# Patient Record
Sex: Female | Born: 1970
Health system: Southern US, Community
[De-identification: ages and names within clinical notes are randomized; demographics above are authoritative.]

## PROBLEM LIST (undated history)

## (undated) DIAGNOSIS — F32A Depression, unspecified: Secondary | ICD-10-CM

## (undated) DIAGNOSIS — Z8041 Family history of malignant neoplasm of ovary: Secondary | ICD-10-CM

## (undated) DIAGNOSIS — Z9189 Other specified personal risk factors, not elsewhere classified: Secondary | ICD-10-CM

## (undated) DIAGNOSIS — F329 Major depressive disorder, single episode, unspecified: Secondary | ICD-10-CM

## (undated) DIAGNOSIS — Z1509 Genetic susceptibility to other malignant neoplasm: Secondary | ICD-10-CM

## (undated) DIAGNOSIS — Z1501 Genetic susceptibility to malignant neoplasm of breast: Secondary | ICD-10-CM

## (undated) DIAGNOSIS — E559 Vitamin D deficiency, unspecified: Secondary | ICD-10-CM

## (undated) DIAGNOSIS — F419 Anxiety disorder, unspecified: Secondary | ICD-10-CM

## (undated) HISTORY — DX: Vitamin D deficiency, unspecified: E55.9

## (undated) HISTORY — DX: Anxiety disorder, unspecified: F41.9

## (undated) HISTORY — DX: Depression, unspecified: F32.A

## (undated) HISTORY — DX: Genetic susceptibility to malignant neoplasm of breast: Z15.01

## (undated) HISTORY — PX: COLPOSCOPY: SHX161

## (undated) HISTORY — DX: Major depressive disorder, single episode, unspecified: F32.9

## (undated) HISTORY — DX: Other specified personal risk factors, not elsewhere classified: Z91.89

## (undated) HISTORY — DX: Genetic susceptibility to other malignant neoplasm: Z15.09

## (undated) HISTORY — DX: Family history of malignant neoplasm of ovary: Z80.41

---

## 2004-12-10 ENCOUNTER — Observation Stay: Payer: Self-pay | Admitting: Unknown Physician Specialty

## 2005-05-02 ENCOUNTER — Observation Stay: Payer: Self-pay

## 2005-05-04 ENCOUNTER — Inpatient Hospital Stay: Payer: Self-pay | Admitting: Unknown Physician Specialty

## 2008-12-31 ENCOUNTER — Ambulatory Visit: Payer: Self-pay

## 2011-09-27 ENCOUNTER — Other Ambulatory Visit: Payer: Self-pay | Admitting: Obstetrics and Gynecology

## 2012-08-01 ENCOUNTER — Ambulatory Visit: Payer: Self-pay | Admitting: Obstetrics and Gynecology

## 2014-07-18 ENCOUNTER — Ambulatory Visit: Payer: Self-pay | Admitting: General Practice

## 2015-10-23 LAB — HM PAP SMEAR: HM Pap smear: NEGATIVE

## 2016-11-22 DIAGNOSIS — Z1509 Genetic susceptibility to other malignant neoplasm: Secondary | ICD-10-CM | POA: Insufficient documentation

## 2016-11-22 DIAGNOSIS — Z1501 Genetic susceptibility to malignant neoplasm of breast: Secondary | ICD-10-CM

## 2016-11-22 HISTORY — DX: Genetic susceptibility to other malignant neoplasm: Z15.09

## 2016-11-22 HISTORY — DX: Genetic susceptibility to malignant neoplasm of breast: Z15.01

## 2016-11-30 ENCOUNTER — Other Ambulatory Visit: Payer: Self-pay | Admitting: Obstetrics and Gynecology

## 2016-11-30 DIAGNOSIS — Z1231 Encounter for screening mammogram for malignant neoplasm of breast: Secondary | ICD-10-CM

## 2016-12-24 ENCOUNTER — Ambulatory Visit
Admission: RE | Admit: 2016-12-24 | Discharge: 2016-12-24 | Disposition: A | Discharge status: Home / Self Care | Payer: Managed Care, Other (non HMO) | Source: Ambulatory Visit | Attending: Obstetrics and Gynecology | Admitting: Obstetrics and Gynecology

## 2016-12-24 ENCOUNTER — Encounter: Payer: Self-pay | Admitting: Radiology

## 2016-12-24 DIAGNOSIS — Z1231 Encounter for screening mammogram for malignant neoplasm of breast: Secondary | ICD-10-CM | POA: Insufficient documentation

## 2016-12-24 LAB — HM MAMMOGRAPHY: HM Mammogram: NORMAL (ref 0–4)

## 2017-12-20 ENCOUNTER — Encounter: Payer: Self-pay | Admitting: Obstetrics and Gynecology

## 2017-12-20 ENCOUNTER — Ambulatory Visit (INDEPENDENT_AMBULATORY_CARE_PROVIDER_SITE_OTHER): Payer: Managed Care, Other (non HMO) | Admitting: Obstetrics and Gynecology

## 2017-12-20 VITALS — BP 128/90 | HR 85 | Ht 67.0 in | Wt 192.0 lb

## 2017-12-20 DIAGNOSIS — R4586 Emotional lability: Secondary | ICD-10-CM | POA: Diagnosis not present

## 2017-12-20 DIAGNOSIS — Z01419 Encounter for gynecological examination (general) (routine) without abnormal findings: Secondary | ICD-10-CM

## 2017-12-20 DIAGNOSIS — Z1231 Encounter for screening mammogram for malignant neoplasm of breast: Secondary | ICD-10-CM

## 2017-12-20 DIAGNOSIS — E782 Mixed hyperlipidemia: Secondary | ICD-10-CM | POA: Insufficient documentation

## 2017-12-20 DIAGNOSIS — Z30431 Encounter for routine checking of intrauterine contraceptive device: Secondary | ICD-10-CM

## 2017-12-20 DIAGNOSIS — E559 Vitamin D deficiency, unspecified: Secondary | ICD-10-CM | POA: Diagnosis not present

## 2017-12-20 DIAGNOSIS — Z1239 Encounter for other screening for malignant neoplasm of breast: Secondary | ICD-10-CM

## 2017-12-20 MED ORDER — SERTRALINE HCL 50 MG PO TABS: 50.0000 mg | ORAL_TABLET | Freq: Every day | ORAL | 3 refills | Status: DC

## 2017-12-20 NOTE — Progress Notes (Signed)
PCP:  Patient, No Pcp Per   Chief Complaint  Patient presents with  . Gynecologic Exam     HPI:      Sharon Rhodes is a 47 y.o. G1P1001 who LMP was No LMP recorded. Patient is not currently having periods (Reason: IUD)., presents today for her annual examination.  Her menses are absent with IUD.  Dysmenorrhea none. She does have occas random light intermenstrual bleeding.  Sex activity: single partner, contraception - IUD. Mirena placed 11/30/16 Last Pap: October 23, 2015  Results were: no abnormalities /neg HPV DNA  Hx of STDs: none  Last mammogram: December 24, 2016  Results were: normal--routine follow-up in 12 months There is no FH of breast cancer. There is a FH of ovarian and uterine cancer in her pat aunt. Pt is CDKN2A positive 2017, which is increased risk for melanoma and pancreatic cancer. Pt sees derm Q6 months and since no FH pancreatic cancer, nothing further to do at this time. The patient does not do self-breast exams.  Tobacco use: The patient denies current or previous tobacco use. Alcohol use: social drinker No drug use.  Exercise: moderately active  She does get adequate calcium and Vitamin D in her diet. She was severely Vit D deficient last yr and is taking supp regularly.   Her fasting lipids were borderline last yr. Pt due for lab check but wants to do them next yr because she is doing diet changes for wt loss and has already lost 5# this month.   Pt takes zoloft 50 mg almost daily for moods, particularly during certain times in her cycle. She has a stressful job and this keeps her stable. No side effects. Wants to cont meds.  Past Medical History:  Diagnosis Date  . Anxiety   . BRCA gene positive 11/2016   CDKN2A pos (My Risk) increased risk of Pancreatic and Melanoma  . Depression   . Vitamin D deficiency     Past Surgical History:  Procedure Laterality Date  . COLPOSCOPY      Family History  Problem Relation Age of Onset  . Melanoma Sister  56       several times  . Breast cancer Paternal Grandmother 65  . Ovarian cancer Paternal Aunt 51    Social History   Socioeconomic History  . Marital status: Married    Spouse name: Not on file  . Number of children: Not on file  . Years of education: Not on file  . Highest education level: Not on file  Social Needs  . Financial resource strain: Not on file  . Food insecurity - worry: Not on file  . Food insecurity - inability: Not on file  . Transportation needs - medical: Not on file  . Transportation needs - non-medical: Not on file  Occupational History  . Not on file  Tobacco Use  . Smoking status: Never Smoker  . Smokeless tobacco: Never Used  Substance and Sexual Activity  . Alcohol use: Yes  . Drug use: No  . Sexual activity: Yes    Partners: Male    Birth control/protection: IUD  Other Topics Concern  . Not on file  Social History Narrative  . Not on file    Current Meds  Medication Sig  . sertraline (ZOLOFT) 50 MG tablet Take 1 tablet (50 mg total) by mouth daily.  . [DISCONTINUED] sertraline (ZOLOFT) 50 MG tablet Take 50 mg by mouth daily.     ROS:  Review  of Systems  Constitutional: Negative for fatigue, fever and unexpected weight change.  Respiratory: Negative for cough, shortness of breath and wheezing.   Cardiovascular: Negative for chest pain, palpitations and leg swelling.  Gastrointestinal: Negative for blood in stool, constipation, diarrhea, nausea and vomiting.  Endocrine: Negative for cold intolerance, heat intolerance and polyuria.  Genitourinary: Negative for dyspareunia, dysuria, flank pain, frequency, genital sores, hematuria, menstrual problem, pelvic pain, urgency, vaginal bleeding, vaginal discharge and vaginal pain.  Musculoskeletal: Negative for back pain, joint swelling and myalgias.  Skin: Negative for rash.  Neurological: Negative for dizziness, syncope, light-headedness, numbness and headaches.  Hematological: Negative for  adenopathy.  Psychiatric/Behavioral: Negative for agitation, confusion, sleep disturbance and suicidal ideas. The patient is not nervous/anxious.      Objective: BP 128/90 (BP Location: Left Arm, Patient Position: Sitting, Cuff Size: Normal)   Pulse 85   Ht _0  (1.702 m)   Wt 192 lb (87.1 kg)   BMI 30.07 kg/m    Physical Exam  Constitutional: She is oriented to person, place, and time. She appears well-developed and well-nourished.  Genitourinary: Vagina normal and uterus normal. There is no rash or tenderness on the right labia. There is no rash or tenderness on the left labia. No erythema or tenderness in the vagina. No vaginal discharge found. Right adnexum does not display mass and does not display tenderness. Left adnexum does not display mass and does not display tenderness.  Cervix exhibits visible IUD strings. Cervix does not exhibit motion tenderness or polyp. Uterus is not enlarged or tender.  Neck: Normal range of motion. No thyromegaly present.  Cardiovascular: Normal rate, regular rhythm and normal heart sounds.  No murmur heard. Pulmonary/Chest: Effort normal and breath sounds normal. Right breast exhibits no mass, no nipple discharge, no skin change and no tenderness. Left breast exhibits no mass, no nipple discharge, no skin change and no tenderness.  Abdominal: Soft. There is no tenderness. There is no guarding.  Musculoskeletal: Normal range of motion.  Neurological: She is alert and oriented to person, place, and time. No cranial nerve deficit.  Psychiatric: She has a normal mood and affect. Her behavior is normal.  Vitals reviewed.   Assessment/Plan: Encounter for annual routine gynecological examination  Encounter for routine checking of intrauterine contraceptive device (IUD) - IUD in place. Due for rem 1/23  Screening for breast cancer - Pt to sched mammo. - Plan: MM DIGITAL SCREENING BILATERAL  Mood changes - Pt takes zoloft almost daily with sx relief.  Rx RF. F/u prn.  - Plan: sertraline (ZOLOFT) 50 MG tablet  Mixed hyperlipidemia - REchk labs next yr per pt request since she is doing diet/exercise/wt loss changes.   Vitamin D deficiency - Taking supp. Rechk next yr with other labs.  Meds ordered this encounter  Medications  . sertraline (ZOLOFT) 50 MG tablet    Sig: Take 1 tablet (50 mg total) by mouth daily.    Dispense:  90 tablet    Refill:  3             GYN counsel breast self exam, mammography screening, adequate intake of calcium and vitamin D, diet and exercise     F/U  Return in about 1 year (around 12/20/2018).  Alicia B. Copland, PA-C 12/20/2017 2:25 PM

## 2017-12-20 NOTE — Patient Instructions (Addendum)
I value your feedback and entrusting us with your care. If you get a [facility] patient survey, I would appreciate you taking the time to let us know about your experience today. Thank you!  [facility]: [phone]    

## 2018-01-12 ENCOUNTER — Ambulatory Visit
Admission: RE | Admit: 2018-01-12 | Discharge: 2018-01-12 | Disposition: A | Discharge status: Home / Self Care | Payer: Managed Care, Other (non HMO) | Source: Ambulatory Visit | Attending: Obstetrics and Gynecology | Admitting: Obstetrics and Gynecology

## 2018-01-12 DIAGNOSIS — Z1239 Encounter for other screening for malignant neoplasm of breast: Secondary | ICD-10-CM

## 2018-01-12 DIAGNOSIS — Z1231 Encounter for screening mammogram for malignant neoplasm of breast: Secondary | ICD-10-CM | POA: Diagnosis present

## 2018-01-13 ENCOUNTER — Encounter: Payer: Self-pay | Admitting: Obstetrics and Gynecology

## 2018-03-01 ENCOUNTER — Other Ambulatory Visit: Payer: Self-pay | Admitting: Obstetrics and Gynecology

## 2018-03-01 NOTE — Telephone Encounter (Signed)
Please advise for refill. Thank you.  

## 2019-01-15 ENCOUNTER — Other Ambulatory Visit: Payer: Self-pay | Admitting: Obstetrics and Gynecology

## 2019-01-15 DIAGNOSIS — Z1231 Encounter for screening mammogram for malignant neoplasm of breast: Secondary | ICD-10-CM

## 2019-01-18 ENCOUNTER — Encounter: Payer: Self-pay | Admitting: Obstetrics and Gynecology

## 2019-01-18 ENCOUNTER — Other Ambulatory Visit (HOSPITAL_COMMUNITY)
Admission: RE | Admit: 2019-01-18 | Discharge: 2019-01-18 | Disposition: A | Discharge status: Home / Self Care | Payer: Managed Care, Other (non HMO) | Source: Ambulatory Visit | Attending: Obstetrics and Gynecology | Admitting: Obstetrics and Gynecology

## 2019-01-18 ENCOUNTER — Ambulatory Visit (INDEPENDENT_AMBULATORY_CARE_PROVIDER_SITE_OTHER): Payer: Managed Care, Other (non HMO) | Admitting: Obstetrics and Gynecology

## 2019-01-18 VITALS — BP 140/82 | HR 82 | Ht 66.5 in | Wt 180.0 lb

## 2019-01-18 DIAGNOSIS — Z01419 Encounter for gynecological examination (general) (routine) without abnormal findings: Secondary | ICD-10-CM

## 2019-01-18 DIAGNOSIS — R4586 Emotional lability: Secondary | ICD-10-CM

## 2019-01-18 DIAGNOSIS — Z124 Encounter for screening for malignant neoplasm of cervix: Secondary | ICD-10-CM

## 2019-01-18 DIAGNOSIS — Z30431 Encounter for routine checking of intrauterine contraceptive device: Secondary | ICD-10-CM

## 2019-01-18 DIAGNOSIS — Z1151 Encounter for screening for human papillomavirus (HPV): Secondary | ICD-10-CM | POA: Insufficient documentation

## 2019-01-18 DIAGNOSIS — T8332XA Displacement of intrauterine contraceptive device, initial encounter: Secondary | ICD-10-CM

## 2019-01-18 DIAGNOSIS — Z1239 Encounter for other screening for malignant neoplasm of breast: Secondary | ICD-10-CM

## 2019-01-18 MED ORDER — SERTRALINE HCL 100 MG PO TABS: 100.0000 mg | ORAL_TABLET | Freq: Every day | ORAL | 3 refills | Status: DC

## 2019-01-18 NOTE — Progress Notes (Signed)
PCP:  Patient, No Pcp Per   Chief Complaint  Patient presents with  . Gynecologic Exam     HPI:      [name] is a 48 y.o. G1P1001 who LMP was Patient's last menstrual period was 12/29/2018 (approximate)., presents today for her annual examination.  Her menses are monthly now with IUD, light, lasting 2-4 days.  Dysmenorrhea none. She does not have intermenstrual bleeding. Has vasomotor sx.  Sex activity: single partner, contraception - IUD. Mirena placed 11/30/16. Strings were visibile in cx os last yr. Last Pap: October 23, 2015  Results were: no abnormalities /neg HPV DNA  Hx of STDs: none  Last mammogram: 2 /21/19 Results were: normal--routine follow-up in 12 months. Has mammo sched 3/20. There is no FH of breast cancer. There is a FH of ovarian and uterine cancer in her pat aunt. Pt is CDKN2A positive 11/2016, which is increased risk for melanoma and pancreatic cancer. Pt sees derm Q6 months and since no FH pancreatic cancer, nothing further to do at this time. The patient does not do self-breast exams.  Tobacco use: The patient denies current or previous tobacco use. Alcohol use: social drinker No drug use.  Exercise: moderately active  She does get adequate calcium and Vitamin D in her diet. She was severely Vit D deficient last yr and is taking supp regularly.   Her fasting lipids were borderline 2018. Pt not interested in lab check . Has lost wt from last yr.   Pt takes zoloft 100 mg almost daily for moods, particularly during certain times in her cycle/work stress. She has a stressful job and this keeps her stable. No side effects. Wants to cont meds.  Past Medical History:  Diagnosis Date  . Anxiety   . Depression   . Family history of ovarian cancer   . Increased risk of melanoma in patient without dysplastic nevus   . Monoallelic mutation of CDKN2A gene 11/2016   MyRisk heterozygous ([id number]); increased risk of melanoma and pancreatic cancer  . Vitamin D  deficiency     Past Surgical History:  Procedure Laterality Date  . COLPOSCOPY      Family History  Problem Relation Age of Onset  . Melanoma Sister 39       several times  . Breast cancer Paternal Grandmother 77  . Ovarian cancer Paternal Aunt 38    Social History   Socioeconomic History  . Marital status: Married    Spouse name: Not on file  . Number of children: Not on file  . Years of education: Not on file  . Highest education level: Not on file  Occupational History  . Not on file  Social Needs  . Financial resource strain: Not on file  . Food insecurity:    Worry: Not on file    Inability: Not on file  . Transportation needs:    Medical: Not on file    Non-medical: Not on file  Tobacco Use  . Smoking status: Never Smoker  . Smokeless tobacco: Never Used  Substance and Sexual Activity  . Alcohol use: Yes  . Drug use: No  . Sexual activity: Yes    Partners: Male    Birth control/protection: I.U.D.    Comment: Mirena  Lifestyle  . Physical activity:    Days per week: 0 days    Minutes per session: 0 min  . Stress: Not at all  Relationships  . Social connections:    Talks on  phone: Once a week    Gets together: Once a week    Attends religious service: More than 4 times per year    Active member of club or organization: No    Attends meetings of clubs or organizations: Never    Relationship status: Divorced  . Intimate partner violence:    Fear of current or ex partner: No    Emotionally abused: No    Physically abused: No    Forced sexual activity: No  Other Topics Concern  . Not on file  Social History Narrative  . Not on file    Current Meds  Medication Sig  . levonorgestrel (MIRENA) 20 MCG/24HR IUD 1 each by Intrauterine route once.  . sertraline (ZOLOFT) 100 MG tablet Take 1 tablet (100 mg total) by mouth daily.  . [DISCONTINUED] sertraline (ZOLOFT) 100 MG tablet TAKE ONE TABLET BY MOUTH ONCE DAILY     ROS:  Review of Systems    Constitutional: Negative for fatigue, fever and unexpected weight change.  Respiratory: Negative for cough, shortness of breath and wheezing.   Cardiovascular: Negative for chest pain, palpitations and leg swelling.  Gastrointestinal: Negative for blood in stool, constipation, diarrhea, nausea and vomiting.  Endocrine: Negative for cold intolerance, heat intolerance and polyuria.  Genitourinary: Negative for dyspareunia, dysuria, flank pain, frequency, genital sores, hematuria, menstrual problem, pelvic pain, urgency, vaginal bleeding, vaginal discharge and vaginal pain.  Musculoskeletal: Negative for back pain, joint swelling and myalgias.  Skin: Negative for rash.  Neurological: Negative for dizziness, syncope, light-headedness, numbness and headaches.  Hematological: Negative for adenopathy.  Psychiatric/Behavioral: Positive for dysphoric mood. Negative for agitation, confusion, sleep disturbance and suicidal ideas. The patient is not nervous/anxious.      Objective: BP 140/82   Pulse 82   Ht 5' 6.5" (1.689 m)   Wt 180 lb (81.6 kg)   LMP 12/29/2018 (Approximate)   BMI 28.62 kg/m    Physical Exam Constitutional:      Appearance: She is well-developed.  Genitourinary:     Vulva, vagina, uterus, right adnexa and left adnexa normal.     No vulval lesion or tenderness noted.     No vaginal discharge, erythema or tenderness.     No cervical motion tenderness or polyp.     No IUD strings visualized.     Uterus is not enlarged or tender.     No right or left adnexal mass present.     Right adnexa not tender.     Left adnexa not tender.     Genitourinary Comments: IUD STRINGS NO LONGER VISIBLE/PALPABLE IN CX OS  Neck:     Musculoskeletal: Normal range of motion.     Thyroid: No thyromegaly.  Cardiovascular:     Rate and Rhythm: Normal rate and regular rhythm.     Heart sounds: Normal heart sounds. No murmur.  Pulmonary:     Effort: Pulmonary effort is normal.     Breath  sounds: Normal breath sounds.  Chest:     Breasts:        Right: No mass, nipple discharge, skin change or tenderness.        Left: No mass, nipple discharge, skin change or tenderness.  Abdominal:     Palpations: Abdomen is soft.     Tenderness: There is no abdominal tenderness. There is no guarding.  Musculoskeletal: Normal range of motion.  Neurological:     Mental Status: She is alert and oriented to person, place, and time.  Cranial Nerves: No cranial nerve deficit.  Psychiatric:        Behavior: Behavior normal.  Vitals signs reviewed.     Assessment/Plan: Encounter for annual routine gynecological examination  Cervical cancer screening - Plan: Cytology - PAP  Screening for HPV (human papillomavirus) - Plan: Cytology - PAP  Screening for breast cancer - Pt has mammo sched  Encounter for routine checking of intrauterine contraceptive device (IUD)  Intrauterine contraceptive device threads lost, initial encounter - Check GYN u/s. Will call with results.   Mood changes - Rx RF zoloft. F/u prn.  - Plan: sertraline (ZOLOFT) 100 MG tablet  Meds ordered this encounter  Medications  . sertraline (ZOLOFT) 100 MG tablet    Sig: Take 1 tablet (100 mg total) by mouth daily.    Dispense:  90 tablet    Refill:  3    Order Specific Question:   Supervising Provider    Answer:   Nadara Mustard [[id number]]             GYN counsel breast self exam, mammography screening, adequate intake of calcium and vitamin D, diet and exercise     F/U  Return in about 1 day (around 01/19/2019) for GYN u/s for IUD string check./1 yr annual  Quamesha Mullet B. Tajuana Kniskern, PA-C 01/18/2019 3:35 PM

## 2019-01-18 NOTE — Patient Instructions (Signed)
I value your feedback and entrusting us with your care. If you get a  patient survey, I would appreciate you taking the time to let us know about your experience today. Thank you! 

## 2019-01-19 ENCOUNTER — Encounter: Payer: Self-pay | Admitting: Obstetrics and Gynecology

## 2019-01-19 ENCOUNTER — Ambulatory Visit (INDEPENDENT_AMBULATORY_CARE_PROVIDER_SITE_OTHER): Payer: Managed Care, Other (non HMO) | Admitting: Obstetrics and Gynecology

## 2019-01-19 ENCOUNTER — Other Ambulatory Visit: Payer: Self-pay | Admitting: Obstetrics and Gynecology

## 2019-01-19 ENCOUNTER — Ambulatory Visit (INDEPENDENT_AMBULATORY_CARE_PROVIDER_SITE_OTHER): Payer: Managed Care, Other (non HMO)

## 2019-01-19 DIAGNOSIS — T8332XA Displacement of intrauterine contraceptive device, initial encounter: Secondary | ICD-10-CM

## 2019-01-19 DIAGNOSIS — N83292 Other ovarian cyst, left side: Secondary | ICD-10-CM

## 2019-01-19 NOTE — Progress Notes (Signed)
Obstetrics & Gynecology Office Visit   Chief Complaint:  Chief Complaint  Patient presents with  . Follow-up    GYN Ultrasound for IUD location    History of Present Illness: 48 y.o. patient presenting for follow up of Mirena IUD placement 1.5 years ago.  The indication for her IUD was contraception.  She denies any complications since her IUD placement.  Still having some menstrual cycles.  IUD string visualized at recent annual.  Review of Systems: Review of Systems  Constitutional: Negative.   Gastrointestinal: Negative.   Genitourinary: Negative.     Past Medical History:  Past Medical History:  Diagnosis Date  . Anxiety   . Depression   . Family history of ovarian cancer   . Increased risk of melanoma in patient without dysplastic nevus   . Monoallelic mutation of CDKN2A gene 11/2016   MyRisk heterozygous ([id number]); increased risk of melanoma and pancreatic cancer  . Vitamin D deficiency     Past Surgical History:  Past Surgical History:  Procedure Laterality Date  . COLPOSCOPY      Gynecologic History: Patient's last menstrual period was 12/29/2018 (approximate).  Obstetric History: G1P1001  Family History:  Family History  Problem Relation Age of Onset  . Melanoma Sister 49       several times  . Breast cancer Paternal Grandmother 45  . Ovarian cancer Paternal Aunt 11    Social History:  Social History   Socioeconomic History  . Marital status: Married    Spouse name: Not on file  . Number of children: Not on file  . Years of education: Not on file  . Highest education level: Not on file  Occupational History  . Not on file  Social Needs  . Financial resource strain: Not on file  . Food insecurity:    Worry: Not on file    Inability: Not on file  . Transportation needs:    Medical: Not on file    Non-medical: Not on file  Tobacco Use  . Smoking status: Never Smoker  . Smokeless tobacco: Never Used  Substance and Sexual Activity  .  Alcohol use: Yes  . Drug use: No  . Sexual activity: Yes    Partners: Male    Birth control/protection: I.U.D.    Comment: Mirena  Lifestyle  . Physical activity:    Days per week: 0 days    Minutes per session: 0 min  . Stress: Not at all  Relationships  . Social connections:    Talks on phone: Once a week    Gets together: Once a week    Attends religious service: More than 4 times per year    Active member of club or organization: No    Attends meetings of clubs or organizations: Never    Relationship status: Divorced  . Intimate partner violence:    Fear of current or ex partner: No    Emotionally abused: No    Physically abused: No    Forced sexual activity: No  Other Topics Concern  . Not on file  Social History Narrative  . Not on file    Allergies:  Allergies  Allergen Reactions  . Latex Dermatitis  . Sulfa Antibiotics Hives    Medications: Prior to Admission medications   Medication Sig Start Date End Date Taking? Authorizing Provider  levonorgestrel (MIRENA) 20 MCG/24HR IUD 1 each by Intrauterine route once.    [provider]  sertraline (ZOLOFT) 100 MG tablet Take  1 tablet (100 mg total) by mouth daily. 01/18/19   Copland, Ilona Sorrel, PA-C    Physical Exam Last menstrual period 12/29/2018. Patient's last menstrual period was 12/29/2018 (approximate).  General: NAD HEENT: normocephalic, anicteric Pulmonary: No increased work of breathing Extremities: no edema, erythema, or tenderness  Neurologic: Grossly intact Psychiatric: mood appropriate, affect full  US Pelvis Transvanginal Non-ob (tv Only)  Result Date: 01/19/2019 Patient Name: Sharon Rhodes DOB: 08-Feb-1971 MRN: [id number] ULTRASOUND REPORT Location: [facility] Date of Service: 01/19/2019 Indications: Unable to see IUD strings Findings: The uterus is anteverted and measures 8.1 x 4.2 x 3.7cm. Echo texture is homogenous without evidence of focal masses. The Endometrium measures 3.9 mm.  IUD appears low and right limb appears bent down towards cervix. Right Ovary measures 2.6 x 1.4 x 1.7 cm. It is normal in appearance. Left Ovary measures 3.0 x 2.4 x 2.7 cm with complex cyst (questionable hemorrhagic cyst) measuring 1.8 x 1.7 x 1.5cm. Survey of the adnexa demonstrates no adnexal masses. There is no free fluid in the cul de sac. Impression: 1. IUD appears low with right limb not in the correct place. 2. Complex left ovarian cyst. Recommendations: 1.Clinical correlation with the patient's History and Physical Exam. Sharon Rhodes, RDMS RVT Images reviewed.  IUD appears to be position in the lower uterine segment as opposed to fundus. Vena Austria, MD, [facility], [facility]     Assessment: 48 y.o. G1P1001 IUD follow up    Plan: Problem List Items Addressed This Visit    None    Visit Diagnoses    Displacement of intrauterine contraceptive device, initial encounter    -  Primary     1.  IUD migration - in lower uterine segment.  Patient is asymptomatic other than having menstrual cycles.  As long as intracavitary should work for contraception.  However, she runs risk of IUD further migrating or being expelled.  My recommendation is for her to have her current IUD replaced.  She wishes to arrange follow up at a later date.  4) A total of 15 minutes were spent in face-to-face contact with the patient during this encounter with over half of that time devoted to counseling and coordination of care.  5) Return with copeland to discuss IUD replacement.   Vena Austria, MD, [facility], [facility]

## 2019-01-22 ENCOUNTER — Telehealth: Payer: Self-pay | Admitting: Obstetrics and Gynecology

## 2019-01-22 LAB — CYTOLOGY - PAP
Diagnosis: NEGATIVE
HPV: NOT DETECTED

## 2019-01-22 NOTE — Telephone Encounter (Signed)
Pt aware of IUD malplacement (saw Dr. Bonney Aid after u/s). IUD ok for contraception. Pt having light, monthly periods, no pelvic pain. Pt will RTO in a few months for IUD replacement. Will call with menses to sched. F/u sooner prn DUB/pain.

## 2019-01-25 ENCOUNTER — Ambulatory Visit
Admission: RE | Admit: 2019-01-25 | Discharge: 2019-01-25 | Disposition: A | Discharge status: Home / Self Care | Payer: Managed Care, Other (non HMO) | Source: Ambulatory Visit | Attending: Obstetrics and Gynecology | Admitting: Obstetrics and Gynecology

## 2019-01-25 DIAGNOSIS — Z1231 Encounter for screening mammogram for malignant neoplasm of breast: Secondary | ICD-10-CM | POA: Diagnosis present

## 2019-01-27 ENCOUNTER — Encounter: Payer: Self-pay | Admitting: Obstetrics and Gynecology

## 2019-05-15 DIAGNOSIS — Z1159 Encounter for screening for other viral diseases: Secondary | ICD-10-CM | POA: Diagnosis not present

## 2019-06-30 DIAGNOSIS — M25572 Pain in left ankle and joints of left foot: Secondary | ICD-10-CM | POA: Diagnosis not present

## 2019-06-30 DIAGNOSIS — S82852A Displaced trimalleolar fracture of left lower leg, initial encounter for closed fracture: Secondary | ICD-10-CM | POA: Diagnosis not present

## 2019-07-02 ENCOUNTER — Other Ambulatory Visit
Admission: RE | Admit: 2019-07-02 | Discharge: 2019-07-02 | Disposition: A | Discharge status: Home / Self Care | Payer: 59 | Source: Ambulatory Visit | Attending: Orthopedic Surgery | Admitting: Orthopedic Surgery

## 2019-07-02 ENCOUNTER — Other Ambulatory Visit: Payer: Self-pay | Admitting: Orthopedic Surgery

## 2019-07-02 ENCOUNTER — Other Ambulatory Visit: Payer: Self-pay

## 2019-07-02 DIAGNOSIS — Z01812 Encounter for preprocedural laboratory examination: Secondary | ICD-10-CM | POA: Diagnosis not present

## 2019-07-02 DIAGNOSIS — S63502A Unspecified sprain of left wrist, initial encounter: Secondary | ICD-10-CM | POA: Diagnosis not present

## 2019-07-02 DIAGNOSIS — S82852A Displaced trimalleolar fracture of left lower leg, initial encounter for closed fracture: Secondary | ICD-10-CM | POA: Diagnosis not present

## 2019-07-02 DIAGNOSIS — Z20828 Contact with and (suspected) exposure to other viral communicable diseases: Secondary | ICD-10-CM | POA: Diagnosis not present

## 2019-07-02 MED ORDER — CEFAZOLIN SODIUM-DEXTROSE 2-4 GM/100ML-% IV SOLN
2.0000 g | INTRAVENOUS | Status: AC
  Administered 2019-07-03: 10:00:00 2 g via INTRAVENOUS

## 2019-07-03 ENCOUNTER — Encounter
Admission: RE | Disposition: A | Discharge status: Home / Self Care | Payer: Self-pay | Source: Home / Self Care | Attending: Orthopedic Surgery

## 2019-07-03 ENCOUNTER — Ambulatory Visit
Admission: RE | Admit: 2019-07-03 | Discharge: 2019-07-03 | Disposition: A | Discharge status: Home / Self Care | Payer: 59 | Attending: Orthopedic Surgery | Admitting: Orthopedic Surgery

## 2019-07-03 ENCOUNTER — Ambulatory Visit: Payer: 59 | Admitting: Anesthesiology

## 2019-07-03 ENCOUNTER — Ambulatory Visit: Payer: 59

## 2019-07-03 ENCOUNTER — Encounter: Payer: Self-pay | Admitting: *Deleted

## 2019-07-03 DIAGNOSIS — W19XXXA Unspecified fall, initial encounter: Secondary | ICD-10-CM | POA: Diagnosis not present

## 2019-07-03 DIAGNOSIS — Z793 Long term (current) use of hormonal contraceptives: Secondary | ICD-10-CM | POA: Diagnosis not present

## 2019-07-03 DIAGNOSIS — S82852A Displaced trimalleolar fracture of left lower leg, initial encounter for closed fracture: Secondary | ICD-10-CM | POA: Insufficient documentation

## 2019-07-03 DIAGNOSIS — S82892B Other fracture of left lower leg, initial encounter for open fracture type I or II: Secondary | ICD-10-CM

## 2019-07-03 DIAGNOSIS — E559 Vitamin D deficiency, unspecified: Secondary | ICD-10-CM | POA: Insufficient documentation

## 2019-07-03 DIAGNOSIS — F419 Anxiety disorder, unspecified: Secondary | ICD-10-CM | POA: Insufficient documentation

## 2019-07-03 DIAGNOSIS — S82899A Other fracture of unspecified lower leg, initial encounter for closed fracture: Secondary | ICD-10-CM

## 2019-07-03 DIAGNOSIS — F329 Major depressive disorder, single episode, unspecified: Secondary | ICD-10-CM | POA: Insufficient documentation

## 2019-07-03 DIAGNOSIS — Z791 Long term (current) use of non-steroidal anti-inflammatories (NSAID): Secondary | ICD-10-CM | POA: Diagnosis not present

## 2019-07-03 DIAGNOSIS — S82832D Other fracture of upper and lower end of left fibula, subsequent encounter for closed fracture with routine healing: Secondary | ICD-10-CM | POA: Diagnosis not present

## 2019-07-03 DIAGNOSIS — Z79899 Other long term (current) drug therapy: Secondary | ICD-10-CM | POA: Diagnosis not present

## 2019-07-03 DIAGNOSIS — R69 Illness, unspecified: Secondary | ICD-10-CM | POA: Diagnosis not present

## 2019-07-03 DIAGNOSIS — Z882 Allergy status to sulfonamides status: Secondary | ICD-10-CM | POA: Diagnosis not present

## 2019-07-03 HISTORY — PX: ORIF ANKLE FRACTURE: SHX5408

## 2019-07-03 LAB — CBC WITH DIFFERENTIAL/PLATELET
Abs Immature Granulocytes: 0.02 10*3/uL (ref 0.00–0.07)
Basophils Absolute: 0 10*3/uL (ref 0.0–0.1)
Basophils Relative: 0 %
Eosinophils Absolute: 0.2 10*3/uL (ref 0.0–0.5)
Eosinophils Relative: 2 %
HCT: 38.8 % (ref 36.0–46.0)
Hemoglobin: 13 g/dL (ref 12.0–15.0)
Immature Granulocytes: 0 %
Lymphocytes Relative: 23 %
Lymphs Abs: 2.1 10*3/uL (ref 0.7–4.0)
MCH: 32 pg (ref 26.0–34.0)
MCHC: 33.5 g/dL (ref 30.0–36.0)
MCV: 95.6 fL (ref 80.0–100.0)
Monocytes Absolute: 0.7 10*3/uL (ref 0.1–1.0)
Monocytes Relative: 8 %
Neutro Abs: 6 10*3/uL (ref 1.7–7.7)
Neutrophils Relative %: 67 %
Platelets: 343 10*3/uL (ref 150–400)
RBC: 4.06 MIL/uL (ref 3.87–5.11)
RDW: 11.9 % (ref 11.5–15.5)
WBC: 8.9 10*3/uL (ref 4.0–10.5)
nRBC: 0 % (ref 0.0–0.2)

## 2019-07-03 LAB — BASIC METABOLIC PANEL
Anion gap: 13 (ref 5–15)
BUN: 11 mg/dL (ref 6–20)
CO2: 23 mmol/L (ref 22–32)
Calcium: 9.3 mg/dL (ref 8.9–10.3)
Chloride: 104 mmol/L (ref 98–111)
Creatinine, Ser: 0.57 mg/dL (ref 0.44–1.00)
GFR calc Af Amer: >60 mL/min (ref >60)
GFR calc non Af Amer: >60 mL/min (ref >60)
Glucose, Bld: 104 mg/dL — ABNORMAL HIGH (ref 70–99)
Potassium: 4 mmol/L (ref 3.5–5.1)
Sodium: 140 mmol/L (ref 135–145)

## 2019-07-03 LAB — POCT PREGNANCY, URINE: Preg Test, Ur: NEGATIVE

## 2019-07-03 LAB — PROTIME-INR
INR: 0.8 (ref 0.8–1.2)
Prothrombin Time: 11 seconds — ABNORMAL LOW (ref 11.4–15.2)

## 2019-07-03 LAB — SARS CORONAVIRUS 2 (TAT 6-24 HRS): SARS Coronavirus 2: NEGATIVE

## 2019-07-03 LAB — APTT: aPTT: 31 seconds (ref 24–36)

## 2019-07-03 SURGERY — OPEN REDUCTION INTERNAL FIXATION (ORIF) ANKLE FRACTURE
Anesthesia: General | Site: Ankle | Laterality: Left

## 2019-07-03 MED ORDER — DEXAMETHASONE SODIUM PHOSPHATE 10 MG/ML IJ SOLN
INTRAMUSCULAR | Status: AC
  Filled 2019-07-03: qty 1

## 2019-07-03 MED ORDER — ONDANSETRON HCL 4 MG/2ML IJ SOLN
INTRAMUSCULAR | Status: AC
  Filled 2019-07-03: qty 2

## 2019-07-03 MED ORDER — CEFAZOLIN SODIUM-DEXTROSE 2-4 GM/100ML-% IV SOLN
INTRAVENOUS | Status: AC
  Filled 2019-07-03: qty 100

## 2019-07-03 MED ORDER — FAMOTIDINE 20 MG PO TABS
20.0000 mg | ORAL_TABLET | Freq: Once | ORAL | Status: AC
  Administered 2019-07-03: 20 mg via ORAL

## 2019-07-03 MED ORDER — FENTANYL CITRATE (PF) 100 MCG/2ML IJ SOLN
INTRAMUSCULAR | Status: AC
  Filled 2019-07-03: qty 2

## 2019-07-03 MED ORDER — ACETAMINOPHEN 10 MG/ML IV SOLN
INTRAVENOUS | Status: AC
  Filled 2019-07-03: qty 100

## 2019-07-03 MED ORDER — ACETAMINOPHEN 10 MG/ML IV SOLN
INTRAVENOUS | Status: DC | PRN
  Administered 2019-07-03: 1000 mg via INTRAVENOUS

## 2019-07-03 MED ORDER — FENTANYL CITRATE (PF) 100 MCG/2ML IJ SOLN
INTRAMUSCULAR | Status: DC | PRN
  Administered 2019-07-03 (×3): 25 ug via INTRAVENOUS
  Administered 2019-07-03 (×2): 50 ug via INTRAVENOUS
  Administered 2019-07-03: 25 ug via INTRAVENOUS
  Administered 2019-07-03 (×2): 50 ug via INTRAVENOUS

## 2019-07-03 MED ORDER — NEOMYCIN-POLYMYXIN B GU 40-200000 IR SOLN
Status: AC
  Filled 2019-07-03: qty 2

## 2019-07-03 MED ORDER — MIDAZOLAM HCL 2 MG/2ML IJ SOLN
INTRAMUSCULAR | Status: AC
  Filled 2019-07-03: qty 2

## 2019-07-03 MED ORDER — DEXMEDETOMIDINE HCL IN NACL 80 MCG/20ML IV SOLN
INTRAVENOUS | Status: AC
  Filled 2019-07-03: qty 20

## 2019-07-03 MED ORDER — DEXMEDETOMIDINE HCL 200 MCG/2ML IV SOLN
INTRAVENOUS | Status: DC | PRN
  Administered 2019-07-03 (×2): 20 ug via INTRAVENOUS

## 2019-07-03 MED ORDER — PROPOFOL 10 MG/ML IV BOLUS
INTRAVENOUS | Status: AC
  Filled 2019-07-03: qty 20

## 2019-07-03 MED ORDER — NEOMYCIN-POLYMYXIN B GU 40-200000 IR SOLN
Status: DC | PRN
  Administered 2019-07-03: 2 mL

## 2019-07-03 MED ORDER — OXYCODONE HCL 5 MG PO TABS: 5.0000 mg | ORAL_TABLET | ORAL | 0 refills | Status: DC | PRN

## 2019-07-03 MED ORDER — DEXAMETHASONE SODIUM PHOSPHATE 10 MG/ML IJ SOLN
INTRAMUSCULAR | Status: DC | PRN
  Administered 2019-07-03: 10 mg via INTRAVENOUS

## 2019-07-03 MED ORDER — ONDANSETRON HCL 4 MG/2ML IJ SOLN: 4.0000 mg | Freq: Once | INTRAMUSCULAR | Status: DC | PRN

## 2019-07-03 MED ORDER — LIDOCAINE HCL (CARDIAC) PF 100 MG/5ML IV SOSY
PREFILLED_SYRINGE | INTRAVENOUS | Status: DC | PRN
  Administered 2019-07-03: 50 mg via INTRAVENOUS

## 2019-07-03 MED ORDER — ONDANSETRON HCL 4 MG/2ML IJ SOLN
INTRAMUSCULAR | Status: DC | PRN
  Administered 2019-07-03: 4 mg via INTRAVENOUS

## 2019-07-03 MED ORDER — BUPIVACAINE HCL (PF) 0.25 % IJ SOLN
INTRAMUSCULAR | Status: AC
  Filled 2019-07-03: qty 30

## 2019-07-03 MED ORDER — FENTANYL CITRATE (PF) 100 MCG/2ML IJ SOLN: 25.0000 ug | INTRAMUSCULAR | Status: DC | PRN

## 2019-07-03 MED ORDER — CHLORHEXIDINE GLUCONATE CLOTH 2 % EX PADS: 6.0000 | MEDICATED_PAD | Freq: Once | CUTANEOUS | Status: DC

## 2019-07-03 MED ORDER — OXYCODONE HCL 5 MG PO TABS
5.0000 mg | ORAL_TABLET | Freq: Once | ORAL | Status: AC
  Administered 2019-07-03: 5 mg via ORAL
  Filled 2019-07-03: qty 1

## 2019-07-03 MED ORDER — PROPOFOL 10 MG/ML IV BOLUS
INTRAVENOUS | Status: DC | PRN
  Administered 2019-07-03: 200 mg via INTRAVENOUS

## 2019-07-03 MED ORDER — ONDANSETRON HCL 4 MG PO TABS: 4.0000 mg | ORAL_TABLET | Freq: Three times a day (TID) | ORAL | 0 refills | Status: DC | PRN

## 2019-07-03 MED ORDER — OXYCODONE HCL 5 MG PO TABS
ORAL_TABLET | ORAL | Status: AC
  Administered 2019-07-03: 5 mg via ORAL
  Filled 2019-07-03: qty 1

## 2019-07-03 MED ORDER — ASPIRIN EC 325 MG PO TBEC: 325.0000 mg | DELAYED_RELEASE_TABLET | Freq: Every day | ORAL | 0 refills | Status: DC

## 2019-07-03 MED ORDER — LACTATED RINGERS IV SOLN
INTRAVENOUS | Status: DC
  Administered 2019-07-03: 08:00:00 via INTRAVENOUS

## 2019-07-03 MED ORDER — FAMOTIDINE 20 MG PO TABS
ORAL_TABLET | ORAL | Status: AC
  Administered 2019-07-03: 08:00:00 20 mg via ORAL
  Filled 2019-07-03: qty 1

## 2019-07-03 MED ORDER — MIDAZOLAM HCL 2 MG/2ML IJ SOLN
INTRAMUSCULAR | Status: DC | PRN
  Administered 2019-07-03: 2 mg via INTRAVENOUS

## 2019-07-03 MED ORDER — CHLORHEXIDINE GLUCONATE CLOTH 2 % EX PADS
6.0000 | MEDICATED_PAD | Freq: Once | CUTANEOUS | Status: AC
  Administered 2019-07-03: 6 via TOPICAL

## 2019-07-03 MED ORDER — BUPIVACAINE HCL 0.25 % IJ SOLN
INTRAMUSCULAR | Status: DC | PRN
  Administered 2019-07-03: 30 mL

## 2019-07-03 SURGICAL SUPPLY — 66 items
BANDAGE ELASTIC 6 LF NS (GAUZE/BANDAGES/DRESSINGS) ×3 IMPLANT
BIT DRILL 2.5X110 QC LCP DISP (BIT) ×3 IMPLANT
BIT DRILL CANN 2.7X625 NONSTRL (BIT) ×3 IMPLANT
BLADE SURG 15 STRL LF DISP TIS (BLADE) ×1 IMPLANT
BLADE SURG 15 STRL SS (BLADE) ×2
BNDG ELASTIC 4X5.8 VLCR NS LF (GAUZE/BANDAGES/DRESSINGS) ×6 IMPLANT
BNDG ESMARK 6X12 TAN STRL LF (GAUZE/BANDAGES/DRESSINGS) ×3 IMPLANT
CLOSURE WOUND 1/2 X4 (GAUZE/BANDAGES/DRESSINGS)
COVER WAND RF STERILE (DRAPES) IMPLANT
CUFF TOURN SGL QUICK 24 (TOURNIQUET CUFF)
CUFF TOURN SGL QUICK 30 (TOURNIQUET CUFF) ×2
CUFF TRNQT CYL 24X4X16.5-23 (TOURNIQUET CUFF) IMPLANT
CUFF TRNQT CYL 30X4X21-28X (TOURNIQUET CUFF) ×1 IMPLANT
DRAPE FLUOR MINI C-ARM 54X84 (DRAPES) ×3 IMPLANT
DRAPE INCISE IOBAN 66X45 STRL (DRAPES) ×3 IMPLANT
DRAPE U-SHAPE 47X51 STRL (DRAPES) ×3 IMPLANT
DURAPREP 26ML APPLICATOR (WOUND CARE) ×6 IMPLANT
ELECT REM PT RETURN 9FT ADLT (ELECTROSURGICAL) ×3
ELECTRODE REM PT RTRN 9FT ADLT (ELECTROSURGICAL) ×1 IMPLANT
GAUZE SPONGE 4X4 12PLY STRL (GAUZE/BANDAGES/DRESSINGS) ×3 IMPLANT
GAUZE XEROFORM 1X8 LF (GAUZE/BANDAGES/DRESSINGS) ×3 IMPLANT
GLOVE BIOGEL PI IND STRL 7.5 (GLOVE) ×3 IMPLANT
GLOVE BIOGEL PI IND STRL 9 (GLOVE) IMPLANT
GLOVE BIOGEL PI INDICATOR 7.5 (GLOVE) ×6
GLOVE BIOGEL PI INDICATOR 9 (GLOVE)
GLOVE SURG 9.0 ORTHO LTXF (GLOVE) ×6 IMPLANT
GLOVE SURG SYN 9.0  PF PI (GLOVE) ×8
GLOVE SURG SYN 9.0 PF PI (GLOVE) ×4 IMPLANT
GOWN STRL REUS TWL 2XL XL LVL4 (GOWN DISPOSABLE) ×3 IMPLANT
GOWN STRL REUS W/ TWL LRG LVL3 (GOWN DISPOSABLE) ×1 IMPLANT
GOWN STRL REUS W/TWL LRG LVL3 (GOWN DISPOSABLE) ×2
GUIDEWIRE THREADED 1.1X150MM (WIRE) ×6 IMPLANT
KIT TURNOVER KIT A (KITS) ×3 IMPLANT
LABEL OR SOLS (LABEL) ×3 IMPLANT
NS IRRIG 1000ML POUR BTL (IV SOLUTION) ×3 IMPLANT
PACK EXTREMITY ARMC (MISCELLANEOUS) ×3 IMPLANT
PAD ABD DERMACEA PRESS 5X9 (GAUZE/BANDAGES/DRESSINGS) ×12 IMPLANT
PAD CAST CTTN 4X4 STRL (SOFTGOODS) ×2 IMPLANT
PADDING CAST COTTON 4X4 STRL (SOFTGOODS) ×4
PLATE LCP 3.5 1/3 TUB 8HX93 (Plate) ×3 IMPLANT
SCREW CANC FT 4.0X20 (Screw) ×3 IMPLANT
SCREW CANC FT ST SFS 4X16 (Screw) ×3 IMPLANT
SCREW CANC FT/18 4.0 (Screw) ×6 IMPLANT
SCREW CANN L THRD/40 4.0 (Screw) ×6 IMPLANT
SCREW CORTEX 3.5 12MM (Screw) ×6 IMPLANT
SCREW CORTEX 3.5 14MM (Screw) ×2 IMPLANT
SCREW CORTEX 3.5 20MM (Screw) ×2 IMPLANT
SCREW CORTEX 3.5 22MM (Screw) ×2 IMPLANT
SCREW CORTEX 3.5 28MM (Screw) ×2 IMPLANT
SCREW LOCK CORT ST 3.5X12 (Screw) ×3 IMPLANT
SCREW LOCK CORT ST 3.5X14 (Screw) ×1 IMPLANT
SCREW LOCK CORT ST 3.5X20 (Screw) ×1 IMPLANT
SCREW LOCK CORT ST 3.5X22 (Screw) ×1 IMPLANT
SCREW LOCK CORT ST 3.5X28 (Screw) ×1 IMPLANT
SPLINT CAST 1 STEP 4X30 (MISCELLANEOUS) ×6 IMPLANT
SPONGE LAP 18X18 RF (DISPOSABLE) ×3 IMPLANT
STAPLER SKIN PROX 35W (STAPLE) ×3 IMPLANT
STOCKINETTE STRL 6IN 960660 (GAUZE/BANDAGES/DRESSINGS) ×3 IMPLANT
STRIP CLOSURE SKIN 1/2X4 (GAUZE/BANDAGES/DRESSINGS) IMPLANT
SUT VIC AB 0 CT2 27 (SUTURE) ×6 IMPLANT
SUT VIC AB 2-0 SH 27 (SUTURE) ×4
SUT VIC AB 2-0 SH 27XBRD (SUTURE) ×2 IMPLANT
SUT VIC AB 3-0 SH 27 (SUTURE) ×2
SUT VIC AB 3-0 SH 27X BRD (SUTURE) ×1 IMPLANT
SYR 30ML LL (SYRINGE) ×3 IMPLANT
TAPE MICROPORE 2IN (TAPE) IMPLANT

## 2019-07-03 NOTE — Progress Notes (Signed)
Post-op check:  Patient in same-day surgery area preparing for discharge.  Her pain is currently controlled.  Her dressing is clean dry and intact.  She can flex and extend her toes, has intact sensation to light touch in all 5 toes in the first dorsal webspace and her toes are well-perfused.  I have reviewed the postoperative x-ray which shows a symmetric mortise and no widening syndesmosis.  The fibula and medial malleolus fractures are anatomically reduced.  The posterior malleolus is obscured by the lateral plate.  Patient will remain nonweightbearing on the left lower extremity.  I have ordered a rolling walker for her to use at home.  Patient will follow-up with me in approximate 10 days in the office.

## 2019-07-03 NOTE — Anesthesia Post-op Follow-up Note (Signed)
Anesthesia QCDR form completed.        

## 2019-07-03 NOTE — Discharge Instructions (Signed)

## 2019-07-03 NOTE — Anesthesia Preprocedure Evaluation (Signed)
Anesthesia Evaluation  Patient identified by MRN, date of birth, ID band Patient awake    Reviewed: Allergy & Precautions, H&P , NPO status , Patient's Chart, lab work & pertinent test results, reviewed documented beta blocker date and time   Airway Mallampati: II  TM Distance: >3 FB Neck ROM: full    Dental  (+) Teeth Intact   Pulmonary neg pulmonary ROS,    Pulmonary exam normal        Cardiovascular Exercise Tolerance: Good negative cardio ROS Normal cardiovascular exam Rate:Normal     Neuro/Psych PSYCHIATRIC DISORDERS Anxiety Depression negative neurological ROS     GI/Hepatic negative GI ROS, Neg liver ROS,   Endo/Other  negative endocrine ROS  Renal/GU negative Renal ROS  negative genitourinary   Musculoskeletal   Abdominal   Peds  Hematology negative hematology ROS (+)   Anesthesia Other Findings   Reproductive/Obstetrics negative OB ROS                             Anesthesia Physical Anesthesia Plan  ASA: II  Anesthesia Plan: General LMA   Post-op Pain Management:    Induction:   PONV Risk Score and Plan: 4 or greater  Airway Management Planned:   Additional Equipment:   Intra-op Plan:   Post-operative Plan:   Informed Consent: I have reviewed the patients History and Physical, chart, labs and discussed the procedure including the risks, benefits and alternatives for the proposed anesthesia with the patient or authorized representative who has indicated his/her understanding and acceptance.       Plan Discussed with: CRNA  Anesthesia Plan Comments:         Anesthesia Quick Evaluation

## 2019-07-03 NOTE — Anesthesia Procedure Notes (Signed)
Procedure Name: LMA Insertion Date/Time: 07/03/2019 9:40 AM Performed by: Jonna Clark, CRNA Pre-anesthesia Checklist: Patient identified, Patient being monitored, Timeout performed, Emergency Drugs available and Suction available Patient Re-evaluated:Patient Re-evaluated prior to induction Oxygen Delivery Method: Circle system utilized Preoxygenation: Pre-oxygenation with 100% oxygen Induction Type: IV induction Ventilation: Mask ventilation without difficulty LMA: LMA inserted LMA Size: 3.5 Tube type: Oral Number of attempts: 1 Placement Confirmation: positive ETCO2 and breath sounds checked- equal and bilateral Tube secured with: Tape Dental Injury: Teeth and Oropharynx as per pre-operative assessment

## 2019-07-03 NOTE — Transfer of Care (Signed)
Immediate Anesthesia Transfer of Care Note  Patient: Sharon Rhodes  Procedure(s) Performed: OPEN REDUCTION INTERNAL FIXATION (ORIF) ANKLE FRACTURE (Left Ankle)  Patient Location: PACU  Anesthesia Type:General  Level of Consciousness: drowsy and patient cooperative  Airway & Oxygen Therapy: Patient Spontanous Breathing and Patient connected to face mask oxygen  Post-op Assessment: Report given to RN and Post -op Vital signs reviewed and stable  Post vital signs: Reviewed and stable  Last Vitals:  Vitals Value Taken Time  BP 118/72 07/03/19 1214  Temp 36.6 C 07/03/19 1214  Pulse 78 07/03/19 1216  Resp 12 07/03/19 1216  SpO2 100 % 07/03/19 1216  Vitals shown include unvalidated device data.  Last Pain:  Vitals:   07/03/19 0813  TempSrc: Temporal  PainSc: 10-Worst pain ever         Complications: No apparent anesthesia complications

## 2019-07-03 NOTE — H&P (Signed)
PREOPERATIVE H&P  Chief Complaint: Z12.458K displaced trimalleolar fracture of left lower leg, initial encounter for closed fracture  HPI: Sharon Rhodes is a 48 y.o. female who presents for preoperative history and physical with a diagnosis of displaced trimalleolar ankle fracture with talar subluxation after a fall last Thursday.  Patient was initially evaluated at an outside urgent care where x-rays revealed a displaced trimalleolar ankle fracture.  Given the fracture displacement patient was recommended for open reduction internal fixation.  Patient works as a Merchandiser, retail and was in agreement with the plan for surgical fixation given her high demands on her lower extremities..   Past Medical History:  Diagnosis Date  . Anxiety   . Depression   . Family history of ovarian cancer   . Increased risk of melanoma in patient without dysplastic nevus   . 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 Vitamin D deficiency    Past Surgical History:  Procedure Laterality Date  . COLPOSCOPY     Social History   Socioeconomic History  . Marital status: Married    Spouse name: Not on file  . Number of children: Not on file  . Years of education: Not on file  . Highest education level: Not on file  Occupational History  . Not on file  Social Needs  . Financial resource strain: Not on file  . Food insecurity    Worry: Not on file    Inability: Not on file  . Transportation needs    Medical: Not on file    Non-medical: Not on file  Tobacco Use  . Smoking status: Never Smoker  . Smokeless tobacco: Never Used  Substance and Sexual Activity  . Alcohol use: Yes  . Drug use: No  . Sexual activity: Yes    Partners: Male    Birth control/protection: I.U.D.    Comment: Mirena  Lifestyle  . Physical activity    Days per week: 0 days    Minutes per session: 0 min  . Stress: Not at all  Relationships   . Social Herbalist on phone: Once a week    Gets together: Once a week    Attends religious service: More than 4 times per year    Active member of club or organization: No    Attends meetings of clubs or organizations: Never    Relationship status: Divorced  Other Topics Concern  . Not on file  Social History Narrative  . Not on file   Family History  Problem Relation Age of Onset  . Melanoma Sister 35       several times  . Breast cancer Paternal Grandmother 29  . Ovarian cancer Paternal Aunt 32   Allergies  Allergen Reactions  . Latex Dermatitis  . Sulfa Antibiotics Hives   Prior to Admission medications   Medication Sig Start Date End Date Taking? Authorizing Provider  acetaminophen-codeine (TYLENOL #3) 300-30 MG tablet Take 1 tablet by mouth every 8 (eight) hours as needed for pain. 06/30/19  Yes [provider]  B Complex-C (B-COMPLEX WITH VITAMIN C) tablet Take 1 tablet by mouth daily.   Yes [provider]  Bioflavonoid Products (VITAMIN C) CHEW Chew 1 tablet by mouth daily.   Yes [provider]  Cholecalciferol (VITAMIN D3 PO) Take 2 tablets by mouth daily.   Yes [provider]  ibuprofen (ADVIL) 200 MG tablet Take 800  mg by mouth 2 (two) times daily.   Yes [provider]  sertraline (ZOLOFT) 100 MG tablet Take 1 tablet (100 mg total) by mouth daily. 01/18/19  Yes Copland, Ilona Sorrellicia B, PA-C  Zinc 50 MG TABS Take 25 mg by mouth daily.   Yes [provider]  levonorgestrel (MIRENA) 20 MCG/24HR IUD 1 each by Intrauterine route once.    [provider]     Positive ROS: All other systems have been reviewed and were otherwise negative with the exception of those mentioned in the HPI and as above.  Physical Exam: General: Alert, no acute distress Cardiovascular: Regular rate and rhythm, no murmurs rubs or gallops.  No pedal edema Respiratory: Clear to auscultation bilaterally, no wheezes rales or  rhonchi. No cyanosis, no use of accessory musculature GI: No organomegaly, abdomen is soft and non-tender nondistended with positive bowel sounds. Skin: Skin intact, no lesions within the operative field. Neurologic: Sensation intact distally Psychiatric: Patient is competent for consent with normal mood and affect  MUSCULOSKELETAL: Left lower extremity: Patient had lateral swelling and ecchymosis extending into her foot laterally.  Her skin is intact.  She has palpable pedal pulses.  Her foot is well-perfused.  She can flex and extend her toes.  Patient had limited ankle range of motion.  She had a posterior subluxation appearance with a slight medial translation to the left ankle.  Assessment: S82.852A displaced trimalleolar fracture of left lower leg, initial encounter for closed fracture  Plan: Plan for Procedure(s): OPEN REDUCTION INTERNAL FIXATION (ORIF) LEFT TRIMALLEOLAR ANKLE FRACTURE  I reviewed the details of the operation as well as the postoperative course with the patient.  I have also discussed the risks and benefits of surgery. The risks include but are not limited to infection, bleeding, nerve or blood vessel injury, joint stiffness or loss of motion, persistent pain, weakness or instability, malunion, nonunion and hardware failure and the need for further surgery. Patient understood these risks and wished to proceed.     Juanell FairlyKevin Anarely Nicholls, MD   07/03/2019 9:31 AM

## 2019-07-04 DIAGNOSIS — S82852A Displaced trimalleolar fracture of left lower leg, initial encounter for closed fracture: Secondary | ICD-10-CM | POA: Diagnosis not present

## 2019-07-05 NOTE — Anesthesia Postprocedure Evaluation (Signed)
Anesthesia Post Note  Patient: Sharon Rhodes  Procedure(s) Performed: OPEN REDUCTION INTERNAL FIXATION (ORIF) ANKLE FRACTURE (Left Ankle)  Patient location during evaluation: PACU Anesthesia Type: General Level of consciousness: awake and alert Pain management: pain level controlled Vital Signs Assessment: post-procedure vital signs reviewed and stable Respiratory status: spontaneous breathing, nonlabored ventilation, respiratory function stable and patient connected to nasal cannula oxygen Cardiovascular status: blood pressure returned to baseline and stable Postop Assessment: no apparent nausea or vomiting Anesthetic complications: no     Last Vitals:  Vitals:   07/03/19 1259 07/03/19 1426  BP: 125/75 128/74  Pulse: 81 77  Resp: 12 18  Temp: 36.8 C   SpO2: 95% 97%    Last Pain:  Vitals:   07/03/19 1426  TempSrc:   PainSc: 2                  Molli Barrows

## 2019-07-11 DIAGNOSIS — S63502A Unspecified sprain of left wrist, initial encounter: Secondary | ICD-10-CM | POA: Diagnosis not present

## 2019-07-17 NOTE — Op Note (Signed)
07/03/2019  11:03 AM  PATIENT:  Sharon Rhodes    PRE-OPERATIVE DIAGNOSIS:  E56.314H displaced trimalleolar fracture of left lower leg, initial encounter for closed fracture  POST-OPERATIVE DIAGNOSIS:  Same  PROCEDURE:  OPEN REDUCTION INTERNAL FIXATION (ORIF) LEFT TRIMALLEOLAR ANKLE FRACTURE  SURGEON:  Thornton Park, MD  ANESTHESIA:   General  PREOPERATIVE INDICATIONS:  [name] is a  48 y.o. female with a diagnosis of S82.852A displaced trimalleolar fracture of left lower leg.  Patient had displacement of her lateral malleolus with talar subluxation.  Given the displacement of the fracture she was recommended for open reduction internal fixation.   I discussed the risks and benefits of surgery. The risks include but are not limited to infection, bleeding requiring blood transfusion, nerve or blood vessel injury, joint stiffness or loss of motion, persistent pain, weakness or instability, malunion, nonunion and hardware failure and the need for further surgery. Medical risks include but are not limited to DVT and pulmonary embolism, myocardial infarction, stroke, pneumonia, respiratory failure and death. Patient understood these risks and wished to proceed.   OPERATIVE IMPLANTS: Synthes 8 hole one third tubular plate for lateral fixation, 2 x 4.0 mm cannulated screws for medial fixation.  OPERATIVE FINDINGS: Displaced trimalleolar ankle fracture with talar subluxation.  OPERATIVE PROCEDURE:   Patient was met in the preoperative area. The left leg was signed my initials and the word yes according the hospital's correct site of surgery protocol.  The patient was brought to the operating room where she underwent general anesthesia. The patient was placed supine on the operative table. A bump was placed under the left hip. A tourniquet was applied to the left thigh.  The lower extremity was prepped and draped in a sterile fashion. A timeout was performed to verify the patient's name, date of  birth, medical record number, correct site of surgery and correct procedure to be performed. It was also used to verify the patient received antibiotics, and that all appropriate instruments, implants and radiographic studies were available in the room. Once all in attendance were in agreement, the case began.  The left lower extremity was exsanguinated with an Esmarch. The tourniquet was inflated to 275 mmHg. A lateral incision was made over the fibula. The subcutaneous tissues were dissected with the Metzenbaum scissor and pickup. Care was taken to avoid injury to the superficial peroneal nerve. The lateral malleolus fracture was identified and irrigated and fracture hematoma was removed. Soft tissue was removed from the fracture site using a periosteal elevator. A fracture reduction clamp was then used to reduce the fracture to an anatomic position.     The lateral malleolus was then drilled in an AP direction, perpendicular to the fracture site to allow for placement of the lag screw.   A single lag screw was advanced across the fracture site by hand. This lag screw compressed the fracture.   An 8 hole, 1/3 tubular plate was then contoured and placed along the lateral fibula. Bicortical screws were placed proximal to the fracture and fully threaded cancellus screws were placed distal the fracture. The fracture reduction and hardware placement were confirmed on AP and lateral imaging.  Once the lateral malleolus was plated, the attention was turned to the medial ankle. A small vertical incision was made over the tip of the medial malleolus.  Soft tissue was dissected with some with the Metzenbaum scissor and pickup. The fracture of the medial malleolus was identified. This was reduced with a dental pick. 2  threaded K wires for the 4.0 cannulated screws were then advanced through the tip of the medial malleolus across the fracture site and into the distal tibia. The position of the K wires was evaluated on  AP and lateral FluoroScan images. The length of the wires were measured with a depth gauge. The wires were then overdrilled with a cannulated drill for the 4.0 cannulated screws. The long threaded 4.0 cannulated screws were then advanced into position by hand, compressing the medial malleolus fracture.    The posterior malleolus was then examined under fluoroscopy. It was felt to be less than 20% of the articular surface and was in a near anatomic position. The decision was made made not to place an AP screw given its stability and small size.  A stress test of the right ankle was then performed under fluoroscopy.  This test did not reveal any syndesmotic injury or opening of the medial clear space.  The medial and lateral incisions were then copiously irrigated. The subcutaneous tissue was closed with 2-0 Vicryl and the skin approximated staples. A dry sterile dressing was applied along with an AO splint. The patient's ankle was positioned in neutral.  0.25%  Marcaine plain was used at the incision site and as an ankle block for postoperative pain control.  The pateint was then awoken from anesthesia, transferred to hospital bed and brought to the PACU in stable condition. I was scrubbed and present the entire case and all sharp and instrument counts were correct at conclusion the case. I spoke to the patient's significant other in the post-op consultation room to let him know the case was performed without complication and the patient was stable in recovery room.    Timoteo Gaul, MD

## 2019-08-01 DIAGNOSIS — S82852A Displaced trimalleolar fracture of left lower leg, initial encounter for closed fracture: Secondary | ICD-10-CM | POA: Diagnosis not present

## 2019-08-16 DIAGNOSIS — S82852A Displaced trimalleolar fracture of left lower leg, initial encounter for closed fracture: Secondary | ICD-10-CM | POA: Diagnosis not present

## 2019-09-05 DIAGNOSIS — S82852A Displaced trimalleolar fracture of left lower leg, initial encounter for closed fracture: Secondary | ICD-10-CM | POA: Diagnosis not present

## 2019-09-05 DIAGNOSIS — Z9181 History of falling: Secondary | ICD-10-CM | POA: Diagnosis not present

## 2019-09-10 DIAGNOSIS — S82852D Displaced trimalleolar fracture of left lower leg, subsequent encounter for closed fracture with routine healing: Secondary | ICD-10-CM | POA: Diagnosis not present

## 2019-09-10 DIAGNOSIS — R69 Illness, unspecified: Secondary | ICD-10-CM | POA: Diagnosis not present

## 2019-09-10 DIAGNOSIS — Z87891 Personal history of nicotine dependence: Secondary | ICD-10-CM | POA: Diagnosis not present

## 2019-09-10 DIAGNOSIS — Z9181 History of falling: Secondary | ICD-10-CM | POA: Diagnosis not present

## 2019-09-13 DIAGNOSIS — Z87891 Personal history of nicotine dependence: Secondary | ICD-10-CM | POA: Diagnosis not present

## 2019-09-13 DIAGNOSIS — R69 Illness, unspecified: Secondary | ICD-10-CM | POA: Diagnosis not present

## 2019-09-13 DIAGNOSIS — Z9181 History of falling: Secondary | ICD-10-CM | POA: Diagnosis not present

## 2019-09-13 DIAGNOSIS — S82852D Displaced trimalleolar fracture of left lower leg, subsequent encounter for closed fracture with routine healing: Secondary | ICD-10-CM | POA: Diagnosis not present

## 2019-09-17 DIAGNOSIS — R69 Illness, unspecified: Secondary | ICD-10-CM | POA: Diagnosis not present

## 2019-09-17 DIAGNOSIS — S82852D Displaced trimalleolar fracture of left lower leg, subsequent encounter for closed fracture with routine healing: Secondary | ICD-10-CM | POA: Diagnosis not present

## 2019-09-17 DIAGNOSIS — Z9181 History of falling: Secondary | ICD-10-CM | POA: Diagnosis not present

## 2019-09-17 DIAGNOSIS — Z87891 Personal history of nicotine dependence: Secondary | ICD-10-CM | POA: Diagnosis not present

## 2019-09-19 DIAGNOSIS — S82852A Displaced trimalleolar fracture of left lower leg, initial encounter for closed fracture: Secondary | ICD-10-CM | POA: Diagnosis not present

## 2019-09-19 DIAGNOSIS — Z9181 History of falling: Secondary | ICD-10-CM | POA: Diagnosis not present

## 2019-09-20 DIAGNOSIS — R69 Illness, unspecified: Secondary | ICD-10-CM | POA: Diagnosis not present

## 2019-09-20 DIAGNOSIS — Z87891 Personal history of nicotine dependence: Secondary | ICD-10-CM | POA: Diagnosis not present

## 2019-09-20 DIAGNOSIS — S82852D Displaced trimalleolar fracture of left lower leg, subsequent encounter for closed fracture with routine healing: Secondary | ICD-10-CM | POA: Diagnosis not present

## 2019-09-20 DIAGNOSIS — Z9181 History of falling: Secondary | ICD-10-CM | POA: Diagnosis not present

## 2019-09-21 DIAGNOSIS — R69 Illness, unspecified: Secondary | ICD-10-CM | POA: Diagnosis not present

## 2019-09-21 DIAGNOSIS — S82852D Displaced trimalleolar fracture of left lower leg, subsequent encounter for closed fracture with routine healing: Secondary | ICD-10-CM | POA: Diagnosis not present

## 2019-09-21 DIAGNOSIS — Z87891 Personal history of nicotine dependence: Secondary | ICD-10-CM | POA: Diagnosis not present

## 2019-09-21 DIAGNOSIS — Z9181 History of falling: Secondary | ICD-10-CM | POA: Diagnosis not present

## 2019-09-27 DIAGNOSIS — M25672 Stiffness of left ankle, not elsewhere classified: Secondary | ICD-10-CM | POA: Diagnosis not present

## 2019-10-02 DIAGNOSIS — M25672 Stiffness of left ankle, not elsewhere classified: Secondary | ICD-10-CM | POA: Diagnosis not present

## 2019-10-03 DIAGNOSIS — M25672 Stiffness of left ankle, not elsewhere classified: Secondary | ICD-10-CM | POA: Diagnosis not present

## 2019-10-03 DIAGNOSIS — Z9181 History of falling: Secondary | ICD-10-CM | POA: Diagnosis not present

## 2019-10-03 DIAGNOSIS — S82852A Displaced trimalleolar fracture of left lower leg, initial encounter for closed fracture: Secondary | ICD-10-CM | POA: Diagnosis not present

## 2019-10-04 DIAGNOSIS — M25672 Stiffness of left ankle, not elsewhere classified: Secondary | ICD-10-CM | POA: Diagnosis not present

## 2019-10-09 DIAGNOSIS — M25672 Stiffness of left ankle, not elsewhere classified: Secondary | ICD-10-CM | POA: Diagnosis not present

## 2019-10-11 DIAGNOSIS — M25672 Stiffness of left ankle, not elsewhere classified: Secondary | ICD-10-CM | POA: Diagnosis not present

## 2019-10-16 DIAGNOSIS — M25672 Stiffness of left ankle, not elsewhere classified: Secondary | ICD-10-CM | POA: Diagnosis not present

## 2019-10-17 DIAGNOSIS — M25672 Stiffness of left ankle, not elsewhere classified: Secondary | ICD-10-CM | POA: Diagnosis not present

## 2019-10-23 DIAGNOSIS — M25672 Stiffness of left ankle, not elsewhere classified: Secondary | ICD-10-CM | POA: Diagnosis not present

## 2019-10-30 DIAGNOSIS — M25672 Stiffness of left ankle, not elsewhere classified: Secondary | ICD-10-CM | POA: Diagnosis not present

## 2019-11-01 DIAGNOSIS — M25672 Stiffness of left ankle, not elsewhere classified: Secondary | ICD-10-CM | POA: Diagnosis not present

## 2019-11-06 DIAGNOSIS — M25672 Stiffness of left ankle, not elsewhere classified: Secondary | ICD-10-CM | POA: Diagnosis not present

## 2019-11-07 DIAGNOSIS — M25672 Stiffness of left ankle, not elsewhere classified: Secondary | ICD-10-CM | POA: Diagnosis not present

## 2019-11-08 DIAGNOSIS — M25672 Stiffness of left ankle, not elsewhere classified: Secondary | ICD-10-CM | POA: Diagnosis not present

## 2019-11-20 DIAGNOSIS — M25672 Stiffness of left ankle, not elsewhere classified: Secondary | ICD-10-CM | POA: Diagnosis not present

## 2019-11-29 DIAGNOSIS — M25672 Stiffness of left ankle, not elsewhere classified: Secondary | ICD-10-CM | POA: Diagnosis not present

## 2019-12-04 DIAGNOSIS — M25672 Stiffness of left ankle, not elsewhere classified: Secondary | ICD-10-CM | POA: Diagnosis not present

## 2019-12-10 DIAGNOSIS — Z20822 Contact with and (suspected) exposure to covid-19: Secondary | ICD-10-CM | POA: Diagnosis not present

## 2019-12-25 ENCOUNTER — Other Ambulatory Visit: Payer: Self-pay | Admitting: Obstetrics and Gynecology

## 2019-12-25 DIAGNOSIS — Z1231 Encounter for screening mammogram for malignant neoplasm of breast: Secondary | ICD-10-CM

## 2019-12-26 DIAGNOSIS — M25672 Stiffness of left ankle, not elsewhere classified: Secondary | ICD-10-CM | POA: Diagnosis not present

## 2019-12-26 DIAGNOSIS — S82845A Nondisplaced bimalleolar fracture of left lower leg, initial encounter for closed fracture: Secondary | ICD-10-CM | POA: Diagnosis not present

## 2020-01-21 ENCOUNTER — Encounter: Payer: Self-pay | Admitting: Obstetrics and Gynecology

## 2020-01-21 ENCOUNTER — Ambulatory Visit (INDEPENDENT_AMBULATORY_CARE_PROVIDER_SITE_OTHER): Payer: 59 | Admitting: Obstetrics and Gynecology

## 2020-01-21 ENCOUNTER — Other Ambulatory Visit: Payer: Self-pay

## 2020-01-21 VITALS — BP 130/90 | Ht 66.5 in | Wt 186.0 lb

## 2020-01-21 DIAGNOSIS — F32A Depression, unspecified: Secondary | ICD-10-CM | POA: Insufficient documentation

## 2020-01-21 DIAGNOSIS — Z1509 Genetic susceptibility to other malignant neoplasm: Secondary | ICD-10-CM | POA: Diagnosis not present

## 2020-01-21 DIAGNOSIS — F329 Major depressive disorder, single episode, unspecified: Secondary | ICD-10-CM | POA: Insufficient documentation

## 2020-01-21 DIAGNOSIS — R4586 Emotional lability: Secondary | ICD-10-CM

## 2020-01-21 DIAGNOSIS — Z01411 Encounter for gynecological examination (general) (routine) with abnormal findings: Secondary | ICD-10-CM | POA: Diagnosis not present

## 2020-01-21 DIAGNOSIS — Z9189 Other specified personal risk factors, not elsewhere classified: Secondary | ICD-10-CM

## 2020-01-21 DIAGNOSIS — Z1231 Encounter for screening mammogram for malignant neoplasm of breast: Secondary | ICD-10-CM

## 2020-01-21 DIAGNOSIS — T8332XA Displacement of intrauterine contraceptive device, initial encounter: Secondary | ICD-10-CM | POA: Diagnosis not present

## 2020-01-21 DIAGNOSIS — E559 Vitamin D deficiency, unspecified: Secondary | ICD-10-CM | POA: Insufficient documentation

## 2020-01-21 DIAGNOSIS — Z8041 Family history of malignant neoplasm of ovary: Secondary | ICD-10-CM | POA: Insufficient documentation

## 2020-01-21 DIAGNOSIS — T8332XD Displacement of intrauterine contraceptive device, subsequent encounter: Secondary | ICD-10-CM

## 2020-01-21 DIAGNOSIS — R69 Illness, unspecified: Secondary | ICD-10-CM | POA: Diagnosis not present

## 2020-01-21 DIAGNOSIS — Z1501 Genetic susceptibility to malignant neoplasm of breast: Secondary | ICD-10-CM | POA: Diagnosis not present

## 2020-01-21 DIAGNOSIS — Z01419 Encounter for gynecological examination (general) (routine) without abnormal findings: Secondary | ICD-10-CM

## 2020-01-21 MED ORDER — SERTRALINE HCL 100 MG PO TABS: 100.0000 mg | ORAL_TABLET | Freq: Every day | ORAL | 3 refills | Status: DC

## 2020-01-21 NOTE — Patient Instructions (Signed)
I value your feedback and entrusting us with your care. If you get a [facility] patient survey, I would appreciate you taking the time to let us know about your experience today. Thank you!  As of November 01, 2019, your lab results will be released to your MyChart immediately, before I even have a chance to see them. Please give me time to review them and contact you if there are any abnormalities. Thank you for your patience.  

## 2020-01-21 NOTE — Progress Notes (Signed)
PCP:  Rica Records, PA-C   Chief Complaint  Patient presents with  . Gynecologic Exam     HPI:      Sharon Rhodes is a 49 y.o. G1P1001 who LMP was No LMP recorded. (Menstrual status: IUD)., presents today for her annual examination.  Her menses are monthly with IUD, light, lasting 2-3 days.  Dysmenorrhea none. She does not have intermenstrual bleeding. IUD found to be in LUS last yr on u/s. Pt was going to get replaced but not having any issues with it. Does have vasomotor sx.  Sex activity: single partner, contraception - IUD. Mirena placed 11/30/16. Strings lost last yr. Had GYN u/s which showed IUD in LUS. Contraception ok but recommended replacement. Pt ok for now.  Last Pap: 01/18/19  Results were: no abnormalities /neg HPV DNA  Hx of STDs: none   Last mammogram: 01/25/19 Results were: normal--routine follow-up in 12 months. Has mammo sched 3/21. There is no FH of breast cancer. There is a FH of ovarian and uterine cancer in her pat aunt. Pt is CDKN2A positive 11/2016, which is increased risk for melanoma and pancreatic cancer. Pt sees derm yearly (sister with melanoma) and since no FH pancreatic cancer, nothing further to do at this time. The patient does not do self-breast exams.  Tobacco use: The patient denies current or previous tobacco use. Alcohol use: social drinker No drug use.  Exercise: not active; broke LT ankle 9/20 and still recovering some  She does get adequate calcium and Vitamin D in her diet. Hx of Vit D deficiency in past.  Pt takes zoloft 100 mg almost daily for moods, particularly during certain times in her cycle/work stress. She has a stressful job and this keeps her stable. No side effects. Wants to cont meds.  Past Medical History:  Diagnosis Date  . Anxiety   . Depression   . Family history of ovarian cancer   . Increased risk of melanoma in patient without dysplastic nevus   . Monoallelic mutation of CDKN2A gene 11/2016   MyRisk heterozygous  (p16INK4a); increased risk of melanoma and pancreatic cancer  . Vitamin D deficiency     Past Surgical History:  Procedure Laterality Date  . COLPOSCOPY    . ORIF ANKLE FRACTURE Left 07/03/2019   Procedure: OPEN REDUCTION INTERNAL FIXATION (ORIF) ANKLE FRACTURE;  Surgeon: Juanell Fairly, MD;  Location: [facility] ORS;  Service: Orthopedics;  Laterality: Left;    Family History  Problem Relation Age of Onset  . Melanoma Sister 46       several times  . Breast cancer Paternal Grandmother 52  . Ovarian cancer Paternal Aunt 107    Social History   Socioeconomic History  . Marital status: Married    Spouse name: Not on file  . Number of children: Not on file  . Years of education: Not on file  . Highest education level: Not on file  Occupational History  . Not on file  Tobacco Use  . Smoking status: Never Smoker  . Smokeless tobacco: Never Used  Substance and Sexual Activity  . Alcohol use: Yes  . Drug use: No  . Sexual activity: Yes    Partners: Male    Birth control/protection: I.U.D.    Comment: Mirena  Other Topics Concern  . Not on file  Social History Narrative  . Not on file   Social Determinants of Health   Financial Resource Strain:   . Difficulty of Paying Living Expenses: Not  on file  Food Insecurity:   . Worried About Charity fundraiser in the Last Year: Not on file  . Ran Out of Food in the Last Year: Not on file  Transportation Needs:   . Lack of Transportation (Medical): Not on file  . Lack of Transportation (Non-Medical): Not on file  Physical Activity:   . Days of Exercise per Week: Not on file  . Minutes of Exercise per Session: Not on file  Stress:   . Feeling of Stress : Not on file  Social Connections:   . Frequency of Communication with Friends and Family: Not on file  . Frequency of Social Gatherings with Friends and Family: Not on file  . Attends Religious Services: Not on file  . Active Member of Clubs or Organizations: Not on file  .  Attends Archivist Meetings: Not on file  . Marital Status: Not on file  Intimate Partner Violence:   . Fear of Current or Ex-Partner: Not on file  . Emotionally Abused: Not on file  . Physically Abused: Not on file  . Sexually Abused: Not on file    Current Meds  Medication Sig  . sertraline (ZOLOFT) 100 MG tablet Take 1 tablet (100 mg total) by mouth daily.  . [DISCONTINUED] sertraline (ZOLOFT) 100 MG tablet Take 1 tablet (100 mg total) by mouth daily.     ROS:  Review of Systems  Constitutional: Negative for fatigue, fever and unexpected weight change.  Respiratory: Negative for cough, shortness of breath and wheezing.   Cardiovascular: Negative for chest pain, palpitations and leg swelling.  Gastrointestinal: Negative for blood in stool, constipation, diarrhea, nausea and vomiting.  Endocrine: Negative for cold intolerance, heat intolerance and polyuria.  Genitourinary: Negative for dyspareunia, dysuria, flank pain, frequency, genital sores, hematuria, menstrual problem, pelvic pain, urgency, vaginal bleeding, vaginal discharge and vaginal pain.  Musculoskeletal: Negative for back pain, joint swelling and myalgias.  Skin: Negative for rash.  Neurological: Negative for dizziness, syncope, light-headedness, numbness and headaches.  Hematological: Negative for adenopathy.  Psychiatric/Behavioral: Positive for dysphoric mood. Negative for agitation, confusion, sleep disturbance and suicidal ideas. The patient is not nervous/anxious.      Objective: BP 130/90   Ht 5' 6.5" (1.689 m)   Wt 186 lb (84.4 kg)   BMI 29.57 kg/m    Physical Exam Constitutional:      Appearance: She is well-developed.  Genitourinary:     Vulva, vagina, cervix, uterus, right adnexa and left adnexa normal.     No vulval lesion or tenderness noted.     No vaginal discharge, erythema or tenderness.     No cervical polyp.     IUD strings visualized.     Uterus is not enlarged or tender.      No right or left adnexal mass present.     Right adnexa not tender.     Left adnexa not tender.     Genitourinary Comments: IUD STRINGS IN CX OS  Neck:     Thyroid: No thyromegaly.  Cardiovascular:     Rate and Rhythm: Normal rate and regular rhythm.     Heart sounds: Normal heart sounds. No murmur.  Pulmonary:     Effort: Pulmonary effort is normal.     Breath sounds: Normal breath sounds.  Chest:     Breasts:        Right: No mass, nipple discharge, skin change or tenderness.        Left: No mass, nipple  discharge, skin change or tenderness.  Abdominal:     Palpations: Abdomen is soft.     Tenderness: There is no abdominal tenderness. There is no guarding.  Musculoskeletal:        General: Normal range of motion.     Cervical back: Normal range of motion.  Neurological:     General: No focal deficit present.     Mental Status: She is alert and oriented to person, place, and time.     Cranial Nerves: No cranial nerve deficit.  Skin:    General: Skin is warm and dry.  Psychiatric:        Mood and Affect: Mood normal.        Behavior: Behavior normal.        Thought Content: Thought content normal.        Judgment: Judgment normal.  Vitals reviewed.     Assessment/Plan: Encounter for annual routine gynecological examination  Encounter for screening mammogram for malignant neoplasm of breast--pt has mammo sched  Displacement of intrauterine contraceptive device, subsequent encounter--pt aware IUD in LUS. Not having any problems with it. Wants to keep it for now. Will f/u prn  Monoallelic mutation of CDKN2A gene--sees derm yearly.   Increased risk of melanoma in patient without dysplastic nevus  Mood changes - Rx RF zoloft. F/u prn.  - Plan: sertraline (ZOLOFT) 100 MG tablet  Meds ordered this encounter  Medications  . sertraline (ZOLOFT) 100 MG tablet    Sig: Take 1 tablet (100 mg total) by mouth daily.    Dispense:  90 tablet    Refill:  3    Order Specific  Question:   Supervising Provider    Answer:   Nadara Mustard [[id number]]             GYN counsel breast self exam, mammography screening, adequate intake of calcium and vitamin D, diet and exercise     F/U  Return in about 1 year (around 01/20/2021)./1 yr annual  Sharon B. Unique Searfoss, PA-C 01/21/2020 4:56 PM

## 2020-01-28 ENCOUNTER — Encounter: Payer: Self-pay | Admitting: Obstetrics and Gynecology

## 2020-01-28 ENCOUNTER — Ambulatory Visit
Admission: RE | Admit: 2020-01-28 | Discharge: 2020-01-28 | Disposition: A | Discharge status: Home / Self Care | Payer: 59 | Source: Ambulatory Visit | Attending: Obstetrics and Gynecology | Admitting: Obstetrics and Gynecology

## 2020-01-28 DIAGNOSIS — Z1231 Encounter for screening mammogram for malignant neoplasm of breast: Secondary | ICD-10-CM | POA: Diagnosis not present

## 2020-04-16 DIAGNOSIS — D2271 Melanocytic nevi of right lower limb, including hip: Secondary | ICD-10-CM | POA: Diagnosis not present

## 2020-04-16 DIAGNOSIS — B360 Pityriasis versicolor: Secondary | ICD-10-CM | POA: Diagnosis not present

## 2020-04-16 DIAGNOSIS — D2262 Melanocytic nevi of left upper limb, including shoulder: Secondary | ICD-10-CM | POA: Diagnosis not present

## 2020-04-16 DIAGNOSIS — D2261 Melanocytic nevi of right upper limb, including shoulder: Secondary | ICD-10-CM | POA: Diagnosis not present

## 2020-04-16 DIAGNOSIS — D225 Melanocytic nevi of trunk: Secondary | ICD-10-CM | POA: Diagnosis not present

## 2020-04-16 DIAGNOSIS — D2272 Melanocytic nevi of left lower limb, including hip: Secondary | ICD-10-CM | POA: Diagnosis not present

## 2020-12-12 ENCOUNTER — Other Ambulatory Visit: Payer: Self-pay | Admitting: Obstetrics and Gynecology

## 2020-12-12 DIAGNOSIS — Z1231 Encounter for screening mammogram for malignant neoplasm of breast: Secondary | ICD-10-CM

## 2021-01-26 ENCOUNTER — Ambulatory Visit: Payer: 59 | Admitting: Obstetrics and Gynecology

## 2021-01-28 ENCOUNTER — Other Ambulatory Visit: Payer: Self-pay

## 2021-01-28 ENCOUNTER — Ambulatory Visit
Admission: RE | Admit: 2021-01-28 | Discharge: 2021-01-28 | Disposition: A | Discharge status: Home / Self Care | Payer: 59 | Source: Ambulatory Visit | Attending: Obstetrics and Gynecology | Admitting: Obstetrics and Gynecology

## 2021-01-28 DIAGNOSIS — Z1231 Encounter for screening mammogram for malignant neoplasm of breast: Secondary | ICD-10-CM | POA: Diagnosis not present

## 2021-01-30 ENCOUNTER — Ambulatory Visit: Payer: 59 | Admitting: Obstetrics and Gynecology

## 2021-01-31 ENCOUNTER — Encounter: Payer: Self-pay | Admitting: Obstetrics and Gynecology

## 2021-02-03 ENCOUNTER — Other Ambulatory Visit (INDEPENDENT_AMBULATORY_CARE_PROVIDER_SITE_OTHER): Payer: 59

## 2021-02-03 ENCOUNTER — Other Ambulatory Visit: Payer: Self-pay

## 2021-02-03 ENCOUNTER — Ambulatory Visit (INDEPENDENT_AMBULATORY_CARE_PROVIDER_SITE_OTHER): Payer: 59 | Admitting: Obstetrics and Gynecology

## 2021-02-03 ENCOUNTER — Encounter: Payer: Self-pay | Admitting: Obstetrics and Gynecology

## 2021-02-03 VITALS — BP 130/90 | Ht 66.5 in | Wt 183.0 lb

## 2021-02-03 DIAGNOSIS — Z1501 Genetic susceptibility to malignant neoplasm of breast: Secondary | ICD-10-CM

## 2021-02-03 DIAGNOSIS — T8332XA Displacement of intrauterine contraceptive device, initial encounter: Secondary | ICD-10-CM

## 2021-02-03 DIAGNOSIS — T8332XD Displacement of intrauterine contraceptive device, subsequent encounter: Secondary | ICD-10-CM | POA: Diagnosis not present

## 2021-02-03 DIAGNOSIS — Z1231 Encounter for screening mammogram for malignant neoplasm of breast: Secondary | ICD-10-CM

## 2021-02-03 DIAGNOSIS — Z1211 Encounter for screening for malignant neoplasm of colon: Secondary | ICD-10-CM | POA: Diagnosis not present

## 2021-02-03 DIAGNOSIS — Z01419 Encounter for gynecological examination (general) (routine) without abnormal findings: Secondary | ICD-10-CM

## 2021-02-03 DIAGNOSIS — Z1509 Genetic susceptibility to other malignant neoplasm: Secondary | ICD-10-CM | POA: Diagnosis not present

## 2021-02-03 DIAGNOSIS — R69 Illness, unspecified: Secondary | ICD-10-CM | POA: Diagnosis not present

## 2021-02-03 DIAGNOSIS — R4586 Emotional lability: Secondary | ICD-10-CM

## 2021-02-03 MED ORDER — SERTRALINE HCL 100 MG PO TABS: 100.0000 mg | ORAL_TABLET | Freq: Every day | ORAL | 3 refills | Status: DC

## 2021-02-03 NOTE — Patient Instructions (Signed)
I value your feedback and you entrusting us with your care. If you get a  patient survey, I would appreciate you taking the time to let us know about your experience today. Thank you! ? ? ?

## 2021-02-03 NOTE — Progress Notes (Signed)
PCP:  Rica Records, PA-C   Chief Complaint  Patient presents with  . Gynecologic Exam    No concerns     HPI:      [name] is a 50 y.o. G1P1001 who LMP was Patient's last menstrual period was 01/28/2021 (approximate)., presents today for her annual examination.  Her menses are monthly with IUD, light, lasting 5-7 days now (were 2-3 days last yr).  Dysmenorrhea none. She does not have intermenstrual bleeding. IUD found to be low in the uterus 2 yrs ago on u/s. Pt was going to get replaced but not having any issues with it; wants to keep for now. Does have vasomotor sx.  Sex activity: single partner, contraception - IUD. Mirena placed 11/30/16. Strings lost 2020. Had GYN u/s which showed IUD appears low and right limb appears bent down towards cervix. Contraception intact but recommended replacement. Pt ok for now. No dyspareunia, bleeding. Last Pap: 01/18/19  Results were: no abnormalities /neg HPV DNA  Hx of STDs: none   Last mammogram: 01/28/21 Results were: normal--routine follow-up in 12 months.  There is no FH of breast cancer. There is a FH of ovarian and uterine cancer in her pat aunt. Pt is CDKN2A positive 11/2016, which is increased risk for melanoma and pancreatic cancer. Pt sees derm yearly (sister with melanoma) and since no FH pancreatic cancer, nothing further to do at this time. The patient does not do self-breast exams.  Tobacco use: The patient denies current or previous tobacco use. Alcohol use: social drinker No drug use.  Exercise: min active  Colonoscopy: never  She does get adequate calcium and Vitamin D in her diet. Hx of Vit D deficiency in past.  Pt takes zoloft 100 mg almost daily for moods, particularly during certain times in her cycle/work stress. She has a stressful job and this keeps her stable. No side effects. Wants to cont meds.  Past Medical History:  Diagnosis Date  . Anxiety   . Depression   . Family history of ovarian cancer   .  Increased risk of melanoma in patient without dysplastic nevus   . Monoallelic mutation of CDKN2A gene 11/2016   MyRisk heterozygous (p16INK4a); increased risk of melanoma and pancreatic cancer  . Vitamin D deficiency     Past Surgical History:  Procedure Laterality Date  . COLPOSCOPY    . ORIF ANKLE FRACTURE Left 07/03/2019   Procedure: OPEN REDUCTION INTERNAL FIXATION (ORIF) ANKLE FRACTURE;  Surgeon: Juanell Fairly, MD;  Location: [facility];  Service: Orthopedics;  Laterality: Left;    Family History  Problem Relation Age of Onset  . Melanoma Sister 56       several times  . Breast cancer Paternal Grandmother 36  . Ovarian cancer Paternal Aunt 31    Social History   Socioeconomic History  . Marital status: Married    Spouse name: Not on file  . Number of children: Not on file  . Years of education: Not on file  . Highest education level: Not on file  Occupational History  . Not on file  Tobacco Use  . Smoking status: Never Smoker  . Smokeless tobacco: Never Used  Vaping Use  . Vaping Use: Never used  Substance and Sexual Activity  . Alcohol use: Yes  . Drug use: No  . Sexual activity: Yes    Partners: Male    Birth control/protection: I.U.D.    Comment: Mirena  Other Topics Concern  . Not on  file  Social History Narrative  . Not on file   Social Determinants of Health   Financial Resource Strain: Not on file  Food Insecurity: Not on file  Transportation Needs: Not on file  Physical Activity: Not on file  Stress: Not on file  Social Connections: Not on file  Intimate Partner Violence: Not on file    Current Meds  Medication Sig  . B Complex-C (B-COMPLEX WITH VITAMIN C) tablet Take 1 tablet by mouth daily.  Marland Kitchen Bioflavonoid Products (VITAMIN C) CHEW Chew 1 tablet by mouth daily.  . Cholecalciferol (VITAMIN D3 PO) Take 2 tablets by mouth daily.  Marland Kitchen ibuprofen (ADVIL) 200 MG tablet Take 800 mg by mouth 2 (two) times daily.  Marland Kitchen levonorgestrel (MIRENA) 20  MCG/24HR IUD 1 each by Intrauterine route once.  . Zinc 50 MG TABS Take 25 mg by mouth daily.  . [DISCONTINUED] sertraline (ZOLOFT) 100 MG tablet Take 1 tablet (100 mg total) by mouth daily.     ROS:  Review of Systems  Constitutional: Negative for fatigue, fever and unexpected weight change.  Respiratory: Negative for cough, shortness of breath and wheezing.   Cardiovascular: Negative for chest pain, palpitations and leg swelling.  Gastrointestinal: Negative for blood in stool, constipation, diarrhea, nausea and vomiting.  Endocrine: Negative for cold intolerance, heat intolerance and polyuria.  Genitourinary: Negative for dyspareunia, dysuria, flank pain, frequency, genital sores, hematuria, menstrual problem, pelvic pain, urgency, vaginal bleeding, vaginal discharge and vaginal pain.  Musculoskeletal: Negative for back pain, joint swelling and myalgias.  Skin: Negative for rash.  Neurological: Negative for dizziness, syncope, light-headedness, numbness and headaches.  Hematological: Negative for adenopathy.  Psychiatric/Behavioral: Positive for dysphoric mood. Negative for agitation, confusion, sleep disturbance and suicidal ideas. The patient is not nervous/anxious.      Objective: BP 130/90   Ht 5' 6.5" (1.689 m)   Wt 183 lb (83 kg)   LMP 01/28/2021 (Approximate)   BMI 29.09 kg/m    Physical Exam Constitutional:      Appearance: She is well-developed.  Genitourinary:     Vulva normal.     Genitourinary Comments: IUD STRINGS IN CX OS     Right Labia: No rash, tenderness or lesions.    Left Labia: No tenderness, lesions or rash.    No vaginal discharge, erythema or tenderness.      Right Adnexa: not tender and no mass present.    Left Adnexa: not tender and no mass present.    No cervical friability or polyp.     No IUD strings visualized.     Cervical exam comments: IUD STRINGS NOT IN CX OS.     Uterus is not enlarged or tender.  Breasts:     Right: No mass,  nipple discharge, skin change or tenderness.     Left: No mass, nipple discharge, skin change or tenderness.    Neck:     Thyroid: No thyromegaly.  Cardiovascular:     Rate and Rhythm: Normal rate and regular rhythm.     Heart sounds: Normal heart sounds. No murmur heard.   Pulmonary:     Effort: Pulmonary effort is normal.     Breath sounds: Normal breath sounds.  Abdominal:     Palpations: Abdomen is soft.     Tenderness: There is no abdominal tenderness. There is no guarding or rebound.  Musculoskeletal:        General: Normal range of motion.     Cervical back: Normal range of motion.  Lymphadenopathy:     Cervical: No cervical adenopathy.  Neurological:     General: No focal deficit present.     Mental Status: She is alert and oriented to person, place, and time.     Cranial Nerves: No cranial nerve deficit.  Skin:    General: Skin is warm and dry.  Psychiatric:        Mood and Affect: Mood normal.        Behavior: Behavior normal.        Thought Content: Thought content normal.        Judgment: Judgment normal.  Vitals reviewed.    RESULTS:  ULTRASOUND REPORT  Location: [facility]  Date of Service: 02/03/2021    Indications:IUD check Findings:  The uterus is anteverted and measures 8.2 x 3.7 x 3.5cm. Echo texture is homogenous without evidence of focal masses.  The Endometrium measures 4.5 mm. IUD in place (slightly low but in correct location)  Right Ovary measures 3.0 x 2.3 x 2.4 cm with dominant follicle Left Ovary measures 2.6 x 1.8 x 2.0 cm. It is normal in appearance. Survey of the adnexa demonstrates no adnexal masses. There is no free fluid in the cul de sac.  Impression: 1. Normal pelvic ultrasound. IUD in place.   Recommendations: 1.Clinical correlation with the patient's History and Physical Exam.   Darlina Guys, RT  Assessment/Plan: Encounter for annual routine gynecological examination  Encounter for screening  mammogram for malignant neoplasm of breast-pt current on mammo  Intrauterine contraceptive device threads lost, initial encounter; IUD in place on GYN u/s.   Displacement of intrauterine contraceptive device, subsequent encounter--pt aware IUD is slightly low. Not having any problems with it. Wants to keep it for now. Will f/u prn  Monoallelic mutation of CDKN2A gene--sees derm yearly. No screening options for pancreatic cancer at this time since no FH.   Increased risk of melanoma in patient without dysplastic nevus  Screening for colon cancer - Plan: Ambulatory referral to Gastroenterology  Mood changes - Rx RF zoloft. F/u prn.  - Plan: sertraline (ZOLOFT) 100 MG tablet  Meds ordered this encounter  Medications  . sertraline (ZOLOFT) 100 MG tablet    Sig: Take 1 tablet (100 mg total) by mouth daily.    Dispense:  90 tablet    Refill:  3    Order Specific Question:   Supervising Provider    Answer:   Nadara Mustard [[id number]]             GYN counsel breast self exam, mammography screening, adequate intake of calcium and vitamin D, diet and exercise     F/U  Return in about 1 year (around 02/03/2022)./1 yr annual  [place] B. Sherry Blackard, PA-C 02/04/2021 8:48 AM

## 2021-02-11 ENCOUNTER — Other Ambulatory Visit: Payer: Self-pay | Admitting: Obstetrics and Gynecology

## 2021-02-11 DIAGNOSIS — R4586 Emotional lability: Secondary | ICD-10-CM

## 2021-03-12 ENCOUNTER — Encounter: Payer: Self-pay | Admitting: *Deleted

## 2021-04-07 IMAGING — DX PORTABLE LEFT ANKLE - 2 VIEW
3 series · 3 of 3 positions shown · non-contrast
Comparison: None.

CLINICAL DATA: Status post left ankle fracture.

EXAM:
PORTABLE LEFT ANKLE - 2 VIEW

[ankle ap]
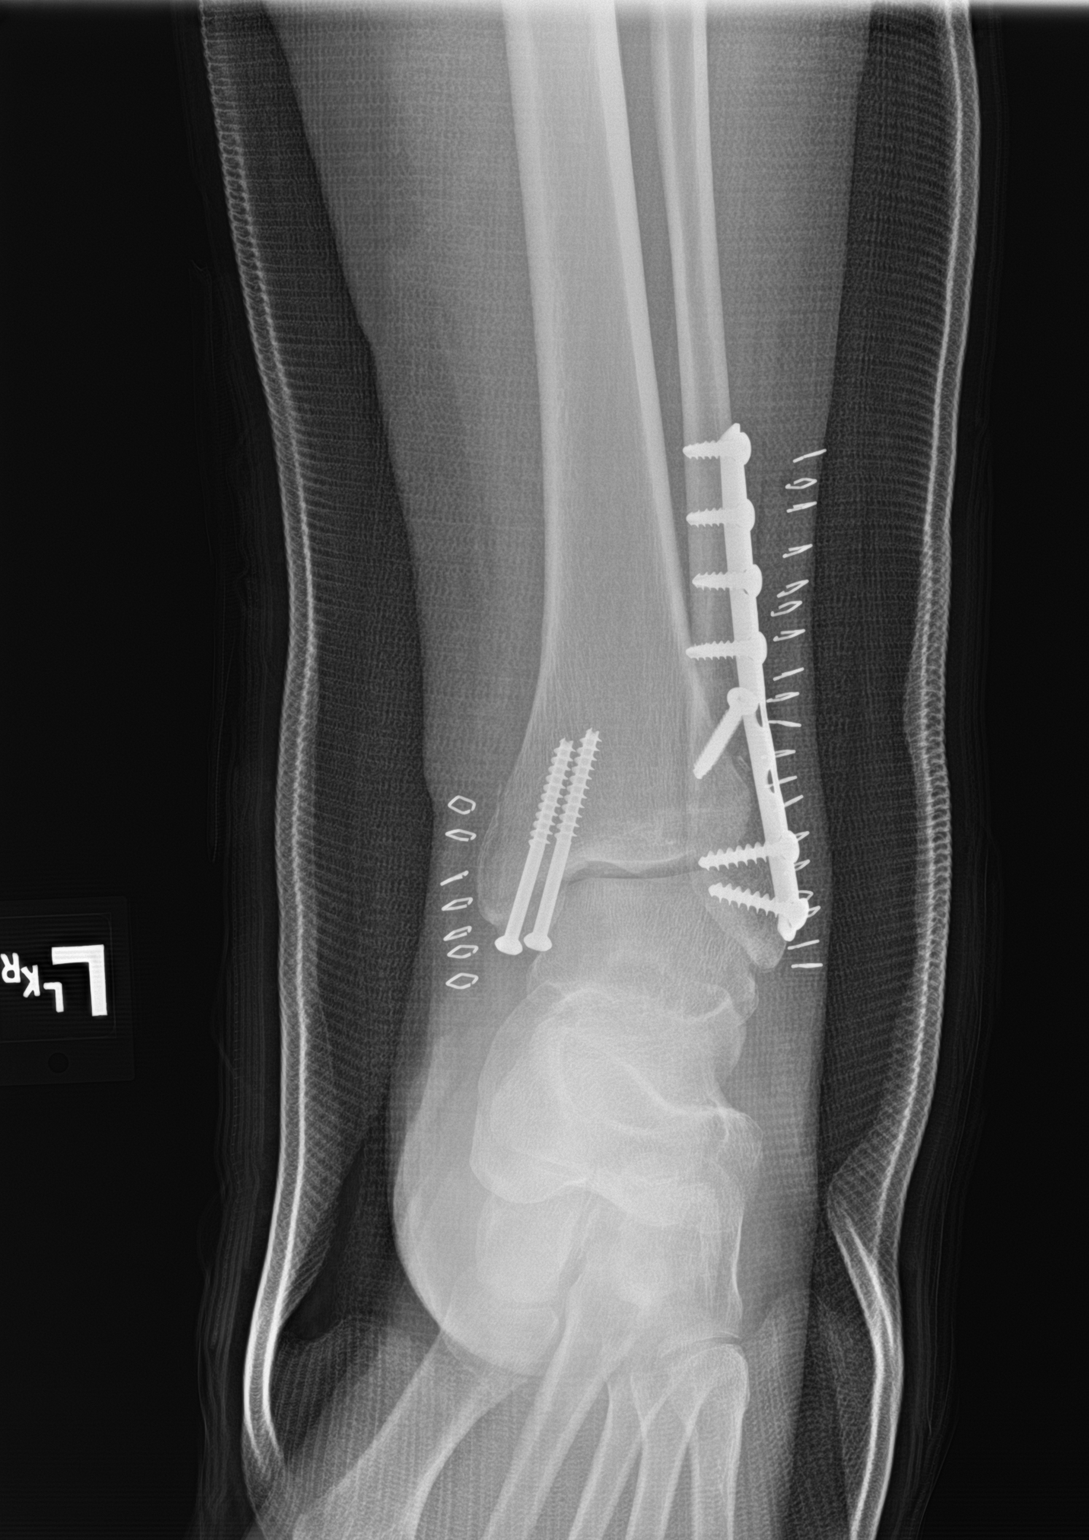

[ankle lat]
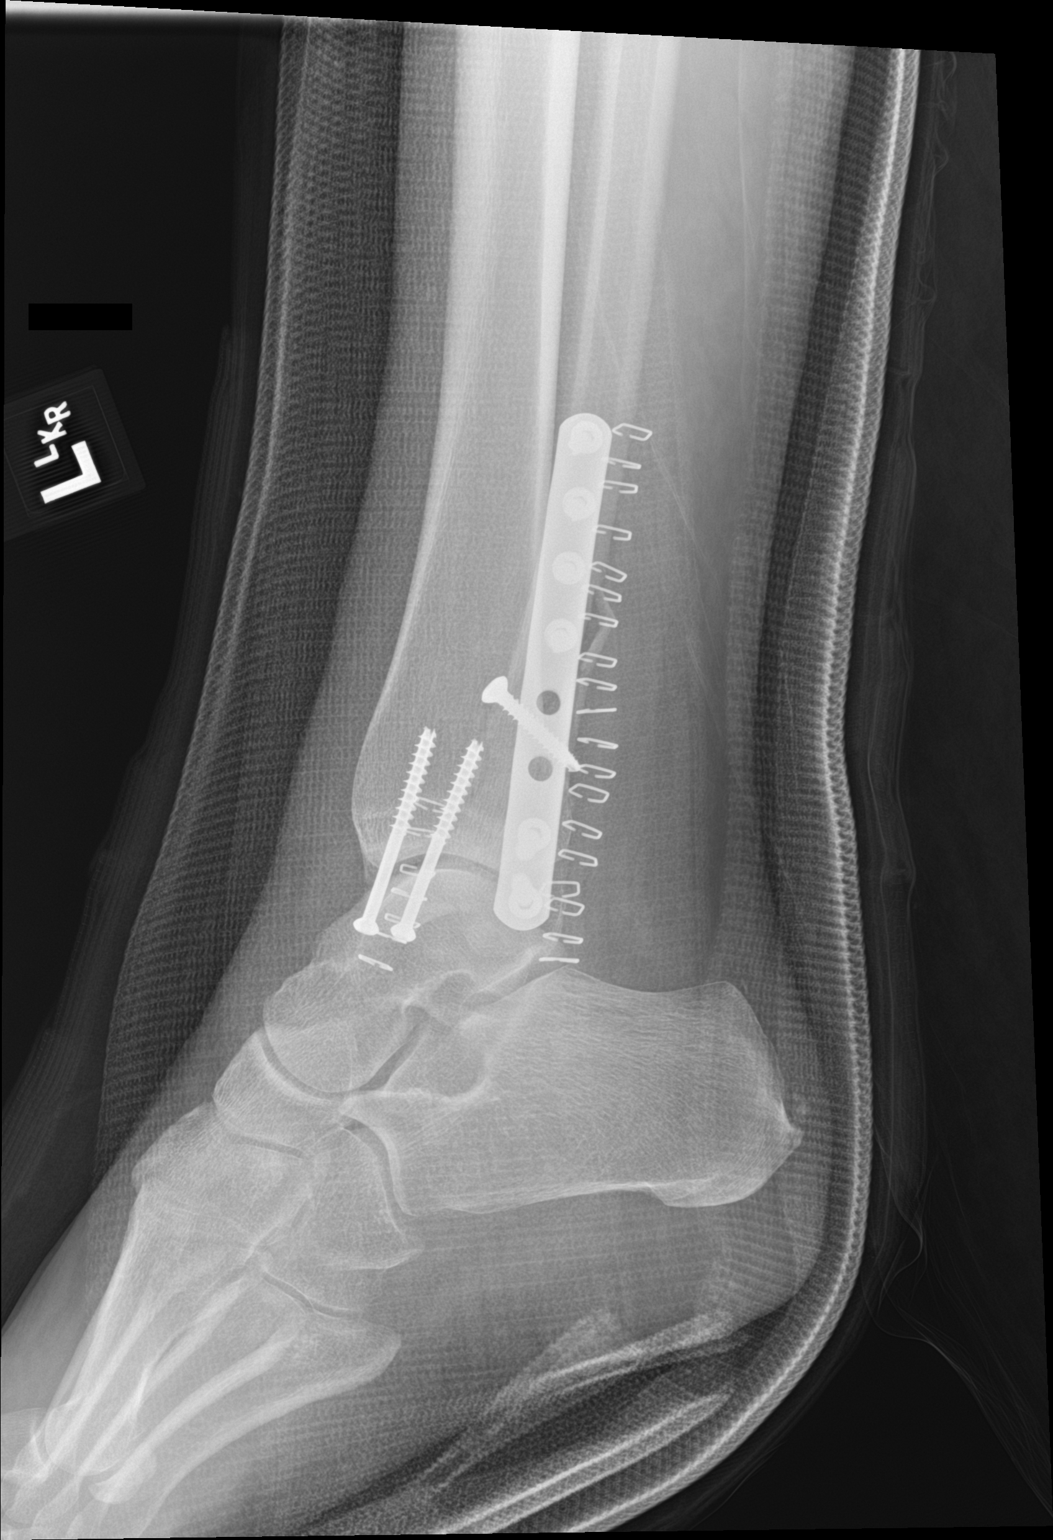

[ankle obl]
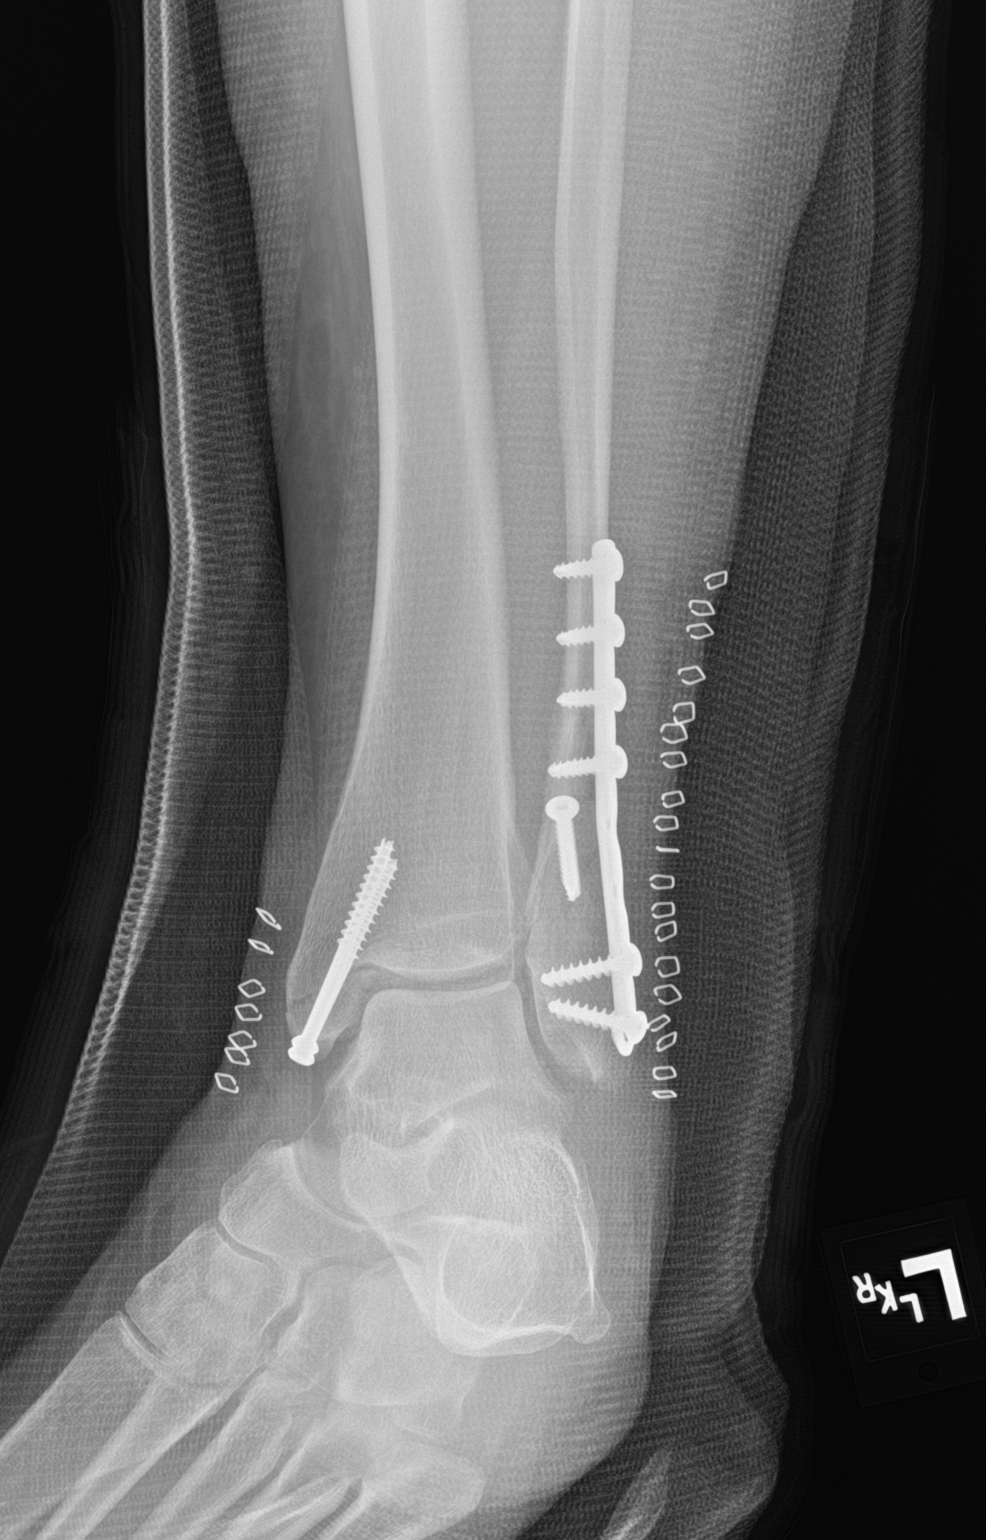

[3 of 3 positions shown; findings below may reference images not displayed]

FINDINGS: The left ankle has been casted and immobilized. Status post surgical
internal fixation of distal left fibular and medial malleolar
fractures. Good alignment of fracture components is noted.
IMPRESSION: Status post surgical internal fixation of distal left fibular and
medial malleolar fractures.

## 2022-02-04 ENCOUNTER — Encounter: Payer: Self-pay | Admitting: Obstetrics and Gynecology

## 2022-02-04 ENCOUNTER — Ambulatory Visit (INDEPENDENT_AMBULATORY_CARE_PROVIDER_SITE_OTHER): Payer: Managed Care, Other (non HMO) | Admitting: Obstetrics and Gynecology

## 2022-02-04 ENCOUNTER — Other Ambulatory Visit: Payer: Self-pay

## 2022-02-04 VITALS — BP 144/90 | Ht 66.5 in | Wt 185.0 lb

## 2022-02-04 DIAGNOSIS — Z30431 Encounter for routine checking of intrauterine contraceptive device: Secondary | ICD-10-CM | POA: Diagnosis not present

## 2022-02-04 DIAGNOSIS — Z1231 Encounter for screening mammogram for malignant neoplasm of breast: Secondary | ICD-10-CM | POA: Diagnosis not present

## 2022-02-04 DIAGNOSIS — Z01419 Encounter for gynecological examination (general) (routine) without abnormal findings: Secondary | ICD-10-CM | POA: Diagnosis not present

## 2022-02-04 DIAGNOSIS — R4586 Emotional lability: Secondary | ICD-10-CM | POA: Insufficient documentation

## 2022-02-04 DIAGNOSIS — Z1211 Encounter for screening for malignant neoplasm of colon: Secondary | ICD-10-CM | POA: Diagnosis not present

## 2022-02-04 MED ORDER — SERTRALINE HCL 100 MG PO TABS: 100.0000 mg | ORAL_TABLET | Freq: Every day | ORAL | 2 refills | Status: DC

## 2022-02-04 NOTE — Progress Notes (Signed)
? ?PCP:  Destany Severns, Deirdre Evener, PA-C ? ? ?Chief Complaint  ?Patient presents with  ? Gynecologic Exam  ?  No concerns  ? ? ? ?HPI: ?     [name] is a 51 y.o. G1P1001 who LMP was Patient's last menstrual period was 02/02/2022 (exact date)., presents today for her annual examination.  Her menses are monthly with IUD, light, lasting 7 days now (were 4 days last yr), mod flow.  Dysmenorrhea none. She does not have intermenstrual bleeding. Does have vasomotor sx. ? ?Sex activity: single partner, contraception - IUD. Mirena placed 11/30/16. Strings lost 2020. Had GYN u/s 2020 which showed IUD appears low and right limb appears bent down towards cervix/2022 GYN u/s "The Endometrium measures 4.5 mm. IUD in place (slightly low but in correct location)." Contraception intact.  No dyspareunia, bleeding. ?Last Pap: 01/18/19  Results were: no abnormalities /neg HPV DNA  ?Hx of STDs: none  ? ?Last mammogram: 01/28/21 Results were: normal--routine follow-up in 12 months.  ?There is no FH of breast cancer. There is a FH of ovarian and uterine cancer in her pat aunt. Pt is CDKN2A (p16INK4A) positive 11/2016 on [facility] testing, which is increased risk for melanoma and pancreatic cancer. Pt sees derm yearly (sister with melanoma) and since no FH pancreatic cancer, nothing further to do at this time. The patient does not do self-breast exams. ? ?Tobacco use: The patient denies current or previous tobacco use. ?Alcohol use: social drinker ?No drug use.  ?Exercise: min active ? ?Colonoscopy: never; hasn't had time; interested in cologuard ? ?She does get adequate calcium and Vitamin D in her diet. Hx of Vit D deficiency in past. ? ?Pt takes zoloft 50-100 mg almost daily for moods, particularly during certain times in her cycle/work stress. She has a stressful job and this keeps her stable. No side effects. Wants to cont meds. ? ?Past Medical History:  ?Diagnosis Date  ? Anxiety   ? Depression   ? Family history of ovarian cancer   ?  Increased risk of melanoma in patient without dysplastic nevus   ? Monoallelic mutation of 123456 gene 11/2016  ? MyRisk heterozygous (p16INK4a); increased risk of melanoma and pancreatic cancer  ? Vitamin D deficiency   ? ? ?Past Surgical History:  ?Procedure Laterality Date  ? COLPOSCOPY    ? ORIF ANKLE FRACTURE Left 07/03/2019  ? Procedure: OPEN REDUCTION INTERNAL FIXATION (ORIF) ANKLE FRACTURE;  Surgeon: Thornton Park, MD;  Location: [facility] OR[facility];  Service: Orthopedics;  Laterality: Left;  ? ? ?Family History  ?Problem Relation Age of Onset  ? Melanoma Sister 47  ?     several times  ? Breast cancer Paternal Grandmother 55  ? Ovarian cancer Paternal Aunt 93  ? ? ?Social History  ? ?Socioeconomic History  ? Marital status: Significant Other  ?  Spouse name: Not on file  ? Number of children: Not on file  ? Years of education: Not on file  ? Highest education level: Not on file  ?Occupational History  ? Not on file  ?Tobacco Use  ? Smoking status: Never  ? Smokeless tobacco: Never  ?Vaping Use  ? Vaping Use: Never used  ?Substance and Sexual Activity  ? Alcohol use: Yes  ? Drug use: No  ? Sexual activity: Yes  ?  Partners: Male  ?  Birth control/protection: I.U.D.  ?  Comment: Mirena  ?Other Topics Concern  ? Not on file  ?Social History Narrative  ? Not  on file  ? ?Social Determinants of Health  ? ?Financial Resource Strain: Not on file  ?Food Insecurity: Not on file  ?Transportation Needs: Not on file  ?Physical Activity: Not on file  ?Stress: Not on file  ?Social Connections: Not on file  ?Intimate Partner Violence: Not on file  ? ? ?Current Meds  ?Medication Sig  ? B Complex-C (B-COMPLEX WITH VITAMIN C) tablet Take 1 tablet by mouth daily.  ? Bioflavonoid Products (VITAMIN C) CHEW Chew 1 tablet by mouth daily.  ? Cholecalciferol (VITAMIN D3 PO) Take 2 tablets by mouth daily.  ? levonorgestrel (MIRENA) 20 MCG/24HR IUD 1 each by Intrauterine route once.  ? Zinc 50 MG TABS Take 25 mg by mouth daily.  ?  [DISCONTINUED] sertraline (ZOLOFT) 100 MG tablet Take 1 tablet (100 mg total) by mouth daily.  ? ? ? ?ROS: ? ?Review of Systems  ?Constitutional:  Negative for fatigue, fever and unexpected weight change.  ?Respiratory:  Negative for cough, shortness of breath and wheezing.   ?Cardiovascular:  Negative for chest pain, palpitations and leg swelling.  ?Gastrointestinal:  Negative for blood in stool, constipation, diarrhea, nausea and vomiting.  ?Endocrine: Negative for cold intolerance, heat intolerance and polyuria.  ?Genitourinary:  Negative for dyspareunia, dysuria, flank pain, frequency, genital sores, hematuria, menstrual problem, pelvic pain, urgency, vaginal bleeding, vaginal discharge and vaginal pain.  ?Musculoskeletal:  Negative for back pain, joint swelling and myalgias.  ?Skin:  Negative for rash.  ?Neurological:  Negative for dizziness, syncope, light-headedness, numbness and headaches.  ?Hematological:  Negative for adenopathy.  ?Psychiatric/Behavioral:  Negative for agitation, confusion, dysphoric mood, sleep disturbance and suicidal ideas. The patient is not nervous/anxious.   ? ? ?Objective: ?BP (!) 144/90   Ht 5' 6.5" (1.689 m)   Wt 185 lb (83.9 kg)   LMP 02/02/2022 (Exact Date)   BMI 29.41 kg/m?  ? ? ?Physical Exam ?Constitutional:   ?   Appearance: She is well-developed.  ?Genitourinary:  ?   Vulva normal.  ?   Genitourinary Comments: IUD STRINGS NOT IN CX OS  ?   Right Labia: No rash, tenderness or lesions. ?   Left Labia: No tenderness, lesions or rash. ?   No vaginal discharge, erythema or tenderness.  ? ?   Right Adnexa: not tender and no mass present. ?   Left Adnexa: not tender and no mass present. ?   No cervical friability or polyp.  ?   No IUD strings visualized.  ?   Cervical exam comments: IUD STRINGS NOT IN CX OS.  ?   Uterus is not enlarged or tender.  ?Breasts: ?   Right: No mass, nipple discharge, skin change or tenderness.  ?   Left: No mass, nipple discharge, skin change or  tenderness.  ?Neck:  ?   Thyroid: No thyromegaly.  ?Cardiovascular:  ?   Rate and Rhythm: Normal rate and regular rhythm.  ?   Heart sounds: Normal heart sounds. No murmur heard. ?Pulmonary:  ?   Effort: Pulmonary effort is normal.  ?   Breath sounds: Normal breath sounds.  ?Abdominal:  ?   Palpations: Abdomen is soft.  ?   Tenderness: There is no abdominal tenderness. There is no guarding or rebound.  ?Musculoskeletal:     ?   General: Normal range of motion.  ?   Cervical back: Normal range of motion.  ?Lymphadenopathy:  ?   Cervical: No cervical adenopathy.  ?Neurological:  ?   General: No focal deficit  present.  ?   Mental Status: She is alert and oriented to person, place, and time.  ?   Cranial Nerves: No cranial nerve deficit.  ?Skin: ?   General: Skin is warm and dry.  ?Psychiatric:     ?   Mood and Affect: Mood normal.     ?   Behavior: Behavior normal.     ?   Thought Content: Thought content normal.     ?   Judgment: Judgment normal.  ?Vitals reviewed.  ? ? ?Assessment/Plan: ?Encounter for annual routine gynecological examination ? ?Encounter for routine checking of intrauterine contraceptive device (IUD); IUD strings not in cx os, has 8 yr indication ? ?Encounter for screening mammogram for malignant neoplasm of breast - Plan: MM 3D SCREEN BREAST BILATERAL; pt to schedule mammo ? ?Screening for colon cancer - Plan: Cologuard; colonoscopy/cologuard discussed. Pt elects cologuard. Ref sent. Will f/u with results.  ? ?Monoallelic mutation of 123456 gene--sees derm yearly. No screening options for pancreatic cancer at this time since no FH.  ? ?Increased risk of melanoma in patient without dysplastic nevus ? ?Mood changes - Rx RF zoloft. F/u prn.  - Plan: sertraline (ZOLOFT) 100 MG tablet ? ?Meds ordered this encounter  ?Medications  ? sertraline (ZOLOFT) 100 MG tablet  ?  Sig: Take 1 tablet (100 mg total) by mouth daily.  ?  Dispense:  90 tablet  ?  Refill:  2  ?  Order Specific Question:   Supervising  Provider  ?  AnswerGae Dry [id number]  ? ?          ?GYN counsel breast self exam, mammography screening, adequate intake of calcium and vitamin D, diet and exercise ? ? ?  F/U ? Return in about 1 year (around 3/16/2

## 2022-02-04 NOTE — Patient Instructions (Signed)
I value your feedback and you entrusting us with your care. If you get a [facility] patient survey, I would appreciate you taking the time to let us know about your experience today. Thank you!  [facility]: [phone]      

## 2022-03-16 ENCOUNTER — Ambulatory Visit
Admission: RE | Admit: 2022-03-16 | Discharge: 2022-03-16 | Disposition: A | Discharge status: Home / Self Care | Payer: BC Managed Care – PPO | Source: Ambulatory Visit | Attending: Obstetrics and Gynecology | Admitting: Obstetrics and Gynecology

## 2022-03-16 DIAGNOSIS — Z1231 Encounter for screening mammogram for malignant neoplasm of breast: Secondary | ICD-10-CM | POA: Insufficient documentation

## 2022-03-17 ENCOUNTER — Encounter: Payer: Self-pay | Admitting: Obstetrics and Gynecology

## 2022-04-22 DIAGNOSIS — L821 Other seborrheic keratosis: Secondary | ICD-10-CM | POA: Diagnosis not present

## 2022-04-22 DIAGNOSIS — D2261 Melanocytic nevi of right upper limb, including shoulder: Secondary | ICD-10-CM | POA: Diagnosis not present

## 2022-04-22 DIAGNOSIS — D2262 Melanocytic nevi of left upper limb, including shoulder: Secondary | ICD-10-CM | POA: Diagnosis not present

## 2022-04-22 DIAGNOSIS — D225 Melanocytic nevi of trunk: Secondary | ICD-10-CM | POA: Diagnosis not present

## 2022-08-24 DIAGNOSIS — Z1211 Encounter for screening for malignant neoplasm of colon: Secondary | ICD-10-CM | POA: Diagnosis not present

## 2022-08-27 DIAGNOSIS — Z23 Encounter for immunization: Secondary | ICD-10-CM | POA: Diagnosis not present

## 2022-08-30 LAB — COLOGUARD: COLOGUARD: NEGATIVE

## 2022-08-31 ENCOUNTER — Encounter: Payer: Self-pay | Admitting: Obstetrics and Gynecology

## 2023-02-08 ENCOUNTER — Other Ambulatory Visit (HOSPITAL_COMMUNITY)
Admission: RE | Admit: 2023-02-08 | Discharge: 2023-02-08 | Disposition: A | Discharge status: Home / Self Care | Payer: BC Managed Care – PPO | Source: Ambulatory Visit | Attending: Obstetrics and Gynecology | Admitting: Obstetrics and Gynecology

## 2023-02-08 ENCOUNTER — Encounter: Payer: Self-pay | Admitting: Obstetrics and Gynecology

## 2023-02-08 ENCOUNTER — Ambulatory Visit (INDEPENDENT_AMBULATORY_CARE_PROVIDER_SITE_OTHER): Payer: BC Managed Care – PPO | Admitting: Obstetrics and Gynecology

## 2023-02-08 VITALS — BP 134/80 | Ht 66.5 in | Wt 184.0 lb

## 2023-02-08 DIAGNOSIS — Z01419 Encounter for gynecological examination (general) (routine) without abnormal findings: Secondary | ICD-10-CM | POA: Diagnosis not present

## 2023-02-08 DIAGNOSIS — Z1151 Encounter for screening for human papillomavirus (HPV): Secondary | ICD-10-CM

## 2023-02-08 DIAGNOSIS — Z124 Encounter for screening for malignant neoplasm of cervix: Secondary | ICD-10-CM

## 2023-02-08 DIAGNOSIS — Z1501 Genetic susceptibility to malignant neoplasm of breast: Secondary | ICD-10-CM

## 2023-02-08 DIAGNOSIS — Z30431 Encounter for routine checking of intrauterine contraceptive device: Secondary | ICD-10-CM

## 2023-02-08 DIAGNOSIS — R4586 Emotional lability: Secondary | ICD-10-CM

## 2023-02-08 DIAGNOSIS — Z1231 Encounter for screening mammogram for malignant neoplasm of breast: Secondary | ICD-10-CM

## 2023-02-08 MED ORDER — SERTRALINE HCL 100 MG PO TABS: 100.0000 mg | ORAL_TABLET | Freq: Every day | ORAL | 2 refills | Status: DC

## 2023-02-08 NOTE — Progress Notes (Signed)
PCP:  Chad Cordial, PA-C   Chief Complaint  Patient presents with   Gynecologic Exam    No concerns     HPI:      [name] is a 52 y.o. G1P1001 who LMP was Patient's last menstrual period was 01/10/2023 (approximate)., presents today for her annual examination.  Her menses are monthly with IUD, light, lasting 7 days now (were 4 days 2 yrs ago), mod flow to heavy flow now, no clots.  Menses heavier compared to when IUD first placed. Dysmenorrhea none. She does not have intermenstrual bleeding. Does have vasomotor sx.  Sex activity: single partner, contraception - IUD. Mirena placed 11/30/16. Strings lost 2020. Had GYN u/s 2020 which showed IUD appears low and right limb appears bent down towards cervix/2022 GYN u/s "The Endometrium measures 4.5 mm. IUD in place (slightly low but in correct location)."  No dyspareunia, bleeding.  Last Pap: 01/18/19  Results were: no abnormalities /neg HPV DNA  Hx of STDs: none   Last mammogram: 03/16/22 Results were: normal--routine follow-up in 12 months.  There is a FH of breast cancer in her PGM. There is a FH of ovarian and uterine cancer in her pat aunt. Pt is CDKN2A (p16INK4A) positive 11/2016 on [facility] testing, which is increased risk for melanoma and pancreatic cancer. Pt sees derm yearly (sister with melanoma 3 times) and since no FH pancreatic cancer, nothing further to do at this time. The patient does not do self-breast exams.  Tobacco use: The patient denies current or previous tobacco use. Alcohol use: social drinker No drug use.  Exercise: min active  Colonoscopy: never; NEG cologuard 10/23, repeat due after 3 yrs  She does get adequate calcium and Vitamin D in her diet. Hx of Vit D deficiency in past.  Pt takes zoloft 50-100 mg almost daily for moods, particularly during certain times in her cycle/work stress. She has a stressful job and this keeps her stable. No side effects. Wants to cont meds.  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); increased risk of melanoma and pancreatic cancer   Vitamin D deficiency     Past Surgical History:  Procedure Laterality Date   COLPOSCOPY     ORIF ANKLE FRACTURE Left 07/03/2019   Procedure: OPEN REDUCTION INTERNAL FIXATION (ORIF) ANKLE FRACTURE;  Surgeon: Thornton Park, MD;  Location: [facility] ORS;  Service: Orthopedics;  Laterality: Left;    Family History  Problem Relation Age of Onset   Melanoma Sister 74       several times   Breast cancer Paternal Grandmother 28   Ovarian cancer Paternal Aunt 47    Social History   Socioeconomic History   Marital status: Significant Other    Spouse name: Not on file   Number of children: Not on file   Years of education: Not on file   Highest education level: Not on file  Occupational History   Not on file  Tobacco Use   Smoking status: Never   Smokeless tobacco: Never  Vaping Use   Vaping Use: Never used  Substance and Sexual Activity   Alcohol use: Yes   Drug use: No   Sexual activity: Yes    Partners: Male    Birth control/protection: I.U.D.  Comment: Mirena  Other Topics Concern   Not on file  Social History Narrative   Not on file   Social Determinants of Health   Financial Resource Strain: Not on file  Food Insecurity: Not on file  Transportation Needs: Not on file  Physical Activity: Inactive (01/17/2019)   Exercise Vital Sign    Days of Exercise per Week: 0 days    Minutes of Exercise per Session: 0 min  Stress: No Stress Concern Present (01/17/2019)   [place]    Feeling of Stress : Not at all  Social Connections: Moderately Isolated (01/17/2019)   Social Connection and Isolation Panel [NHANES]    Frequency of  Communication with Friends and Family: Once a week    Frequency of Social Gatherings with Friends and Family: Once a week    Attends Religious Services: More than 4 times per year    Active Member of [employer] or Organizations: No    Attends Archivist Meetings: Never    Marital Status: Divorced  Human resources officer Violence: Not At Risk (01/17/2019)   Humiliation, Afraid, Rape, and Kick questionnaire    Fear of Current or Ex-Partner: No    Emotionally Abused: No    Physically Abused: No    Sexually Abused: No    Current Meds  Medication Sig   B Complex-C (B-COMPLEX WITH VITAMIN C) tablet Take 1 tablet by mouth daily.   Bioflavonoid Products (VITAMIN C) CHEW Chew 1 tablet by mouth daily.   Cholecalciferol (VITAMIN D3 PO) Take 2 tablets by mouth daily.   levonorgestrel (MIRENA) 20 MCG/24HR IUD 1 each by Intrauterine route once.   Zinc 50 MG TABS Take 25 mg by mouth daily.   [DISCONTINUED] sertraline (ZOLOFT) 100 MG tablet Take 1 tablet (100 mg total) by mouth daily.     ROS:  Review of Systems  Constitutional:  Negative for fatigue, fever and unexpected weight change.  Respiratory:  Negative for cough, shortness of breath and wheezing.   Cardiovascular:  Negative for chest pain, palpitations and leg swelling.  Gastrointestinal:  Negative for blood in stool, constipation, diarrhea, nausea and vomiting.  Endocrine: Negative for cold intolerance, heat intolerance and polyuria.  Genitourinary:  Negative for dyspareunia, dysuria, flank pain, frequency, genital sores, hematuria, menstrual problem, pelvic pain, urgency, vaginal bleeding, vaginal discharge and vaginal pain.  Musculoskeletal:  Negative for back pain, joint swelling and myalgias.  Skin:  Negative for rash.  Neurological:  Negative for dizziness, syncope, light-headedness, numbness and headaches.  Hematological:  Negative for adenopathy.  Psychiatric/Behavioral:  Positive for dysphoric mood. Negative for agitation,  confusion, sleep disturbance and suicidal ideas. The patient is not nervous/anxious.      Objective: BP 134/80   Ht 5' 6.5" (1.689 m)   Wt 184 lb (83.5 kg)   LMP 01/10/2023 (Approximate)   BMI 29.25 kg/m    Physical Exam Constitutional:      Appearance: She is well-developed.  Genitourinary:     Vulva normal.     Genitourinary Comments: IUD STRINGS NOT IN CX OS     Right Labia: No rash, tenderness or lesions.    Left Labia: No tenderness, lesions or rash.    No vaginal discharge, erythema or tenderness.      Right Adnexa: not tender and no mass present.    Left Adnexa: not tender and no mass present.    No cervical friability or polyp.     No IUD strings visualized.  Cervical exam comments: IUD STRINGS NOT IN CX OS.     Uterus is not enlarged or tender.  Breasts:    Right: No mass, nipple discharge, skin change or tenderness.     Left: No mass, nipple discharge, skin change or tenderness.  Neck:     Thyroid: No thyromegaly.  Cardiovascular:     Rate and Rhythm: Normal rate and regular rhythm.     Heart sounds: Normal heart sounds. No murmur heard. Pulmonary:     Effort: Pulmonary effort is normal.     Breath sounds: Normal breath sounds.  Abdominal:     Palpations: Abdomen is soft.     Tenderness: There is no abdominal tenderness. There is no guarding or rebound.  Musculoskeletal:        General: Normal range of motion.     Cervical back: Normal range of motion.  Lymphadenopathy:     Cervical: No cervical adenopathy.  Neurological:     General: No focal deficit present.     Mental Status: She is alert and oriented to person, place, and time.     Cranial Nerves: No cranial nerve deficit.  Skin:    General: Skin is warm and dry.  Psychiatric:        Mood and Affect: Mood normal.        Behavior: Behavior normal.        Thought Content: Thought content normal.        Judgment: Judgment normal.  Vitals reviewed.     Assessment/Plan: Encounter for annual  routine gynecological examination  Cervical cancer screening - Plan: Cytology - PAP  Screening for HPV (human papillomavirus) - Plan: Cytology - PAP  Encounter for routine checking of intrauterine contraceptive device (IUD)--has 8 yr indication; IUD strings not in cx os; discussed replacement early for better period control. Pt to consider and f/u with menses for replacement prn.   Encounter for screening mammogram for malignant neoplasm of breast - Plan: MM 3D SCREENING MAMMOGRAM BILATERAL BREAST; pt to schedule mammo  Monoallelic mutation of 123456 gene--pt seeing derm; no FH pancreatic cancer. Will cont to follow FH  Mood changes - Plan: sertraline (ZOLOFT) 100 MG tablet; Rx RF. Doing well.    Meds ordered this encounter  Medications   sertraline (ZOLOFT) 100 MG tablet    Sig: Take 1 tablet (100 mg total) by mouth daily.    Dispense:  90 tablet    Refill:  2    Order Specific Question:   Supervising Provider    Answer:   Renaldo Reel             GYN counsel breast self exam, mammography screening, adequate intake of calcium and vitamin D, diet and exercise     F/U  Return in about 1 year (around 02/08/2024)./1 yr annual  [place] B. Khadeem Rockett, PA-C 02/08/2023 4:38 PM

## 2023-02-08 NOTE — Patient Instructions (Signed)
I value your feedback and you entrusting us with your care. If you get a [place] patient survey, I would appreciate you taking the time to let us know about your experience today. Thank you!  [facility]: [phone]      

## 2023-02-11 LAB — CYTOLOGY - PAP
Adequacy: ABSENT
Comment: NEGATIVE
Diagnosis: NEGATIVE
High risk HPV: NEGATIVE

## 2023-03-21 ENCOUNTER — Ambulatory Visit
Admission: RE | Admit: 2023-03-21 | Discharge: 2023-03-21 | Disposition: A | Discharge status: Home / Self Care | Payer: BC Managed Care – PPO | Source: Ambulatory Visit | Attending: Obstetrics and Gynecology | Admitting: Obstetrics and Gynecology

## 2023-03-21 DIAGNOSIS — Z1231 Encounter for screening mammogram for malignant neoplasm of breast: Secondary | ICD-10-CM | POA: Diagnosis not present

## 2023-03-23 ENCOUNTER — Encounter: Payer: Self-pay | Admitting: Obstetrics and Gynecology

## 2023-05-05 DIAGNOSIS — D2262 Melanocytic nevi of left upper limb, including shoulder: Secondary | ICD-10-CM | POA: Diagnosis not present

## 2023-05-05 DIAGNOSIS — D2271 Melanocytic nevi of right lower limb, including hip: Secondary | ICD-10-CM | POA: Diagnosis not present

## 2023-05-05 DIAGNOSIS — D2261 Melanocytic nevi of right upper limb, including shoulder: Secondary | ICD-10-CM | POA: Diagnosis not present

## 2023-05-05 DIAGNOSIS — D225 Melanocytic nevi of trunk: Secondary | ICD-10-CM | POA: Diagnosis not present

## 2023-09-04 DIAGNOSIS — Z6829 Body mass index (BMI) 29.0-29.9, adult: Secondary | ICD-10-CM | POA: Diagnosis not present

## 2023-09-04 DIAGNOSIS — Z91030 Bee allergy status: Secondary | ICD-10-CM | POA: Diagnosis not present

## 2023-11-29 ENCOUNTER — Other Ambulatory Visit: Payer: Self-pay | Admitting: Obstetrics and Gynecology

## 2023-11-29 DIAGNOSIS — R4586 Emotional lability: Secondary | ICD-10-CM

## 2023-11-29 MED ORDER — SERTRALINE HCL 100 MG PO TABS: 100.0000 mg | ORAL_TABLET | Freq: Every day | ORAL | 0 refills | Status: DC

## 2023-11-29 NOTE — Addendum Note (Signed)
 Addended by: Althea Grimmer B on: 11/29/2023 03:22 PM   Modules accepted: Orders

## 2023-11-29 NOTE — Telephone Encounter (Signed)
 Patient has scheduled appointment for Annual 02/13/24. Request refill of sertraline (ZOLOFT) 100 MG tablet . She has 1-2 weeks left of current rx.

## 2023-11-29 NOTE — Telephone Encounter (Signed)
Rx RF eRxd.  

## 2024-02-09 NOTE — Progress Notes (Unsigned)
 PCP:  Rica Records, PA-C   Chief Complaint  Patient presents with  . Gynecologic Exam    No concerns     HPI:      [name] is a 53 y.o. G1P1001 who LMP was No LMP recorded. (Menstrual status: IUD)., presents today for her annual examination.  Her menses are monthly with IUD, light, lasting 7 days now (were 4 days 2 yrs ago), mod flow to heavy flow now, no clots.  Menses heavier compared to when IUD first placed. Dysmenorrhea none. She does not have intermenstrual bleeding. Does have vasomotor sx.  Sex activity: single partner, contraception - IUD. Mirena placed 11/30/16. Strings lost 2020. Had GYN u/s 2020 which showed IUD appears low and right limb appears bent down towards cervix/2022 GYN u/s "The Endometrium measures 4.5 mm. IUD in place (slightly low but in correct location)."  No dyspareunia, bleeding.  Last Pap: 02/08/23  Results were: no abnormalities /neg HPV DNA  Hx of STDs: none   Last mammogram: 03/21/23 Results were: normal--routine follow-up in 12 months.  There is a FH of breast cancer in her PGM. There is a FH of ovarian and uterine cancer in her pat aunt. Pt is CDKN2A (p16INK4A) positive 11/2016 on [facility] testing, which is increased risk for melanoma and pancreatic cancer. Pt sees derm yearly (sister with melanoma 3 times) and since no FH pancreatic cancer, nothing further to do at this time. The patient does not do self-breast exams.  Tobacco use: The patient denies current or previous tobacco use. Alcohol use: social drinker No drug use.  Exercise: min active  Colonoscopy: never; NEG cologuard 10/23, repeat due after 3 yrs  She does get adequate calcium and Vitamin D in her diet. Hx of Vit D deficiency in past.  Pt takes zoloft 50-100 mg almost daily for moods, particularly during certain times in her cycle/work stress. She has a stressful job and this keeps her stable. No side effects. Wants to cont meds.  Past Medical History:  Diagnosis Date  .  Anxiety   . Depression   . Family history of ovarian cancer   . Increased risk of melanoma in patient without dysplastic nevus   . Monoallelic mutation of CDKN2A gene 11/2016   MyRisk heterozygous (p16INK4a); increased risk of melanoma and pancreatic cancer  . Vitamin D deficiency     Past Surgical History:  Procedure Laterality Date  . COLPOSCOPY    . ORIF ANKLE FRACTURE Left 07/03/2019   Procedure: OPEN REDUCTION INTERNAL FIXATION (ORIF) ANKLE FRACTURE;  Surgeon: Juanell Fairly, MD;  Location: [facility] ORS;  Service: Orthopedics;  Laterality: Left;    Family History  Problem Relation Age of Onset  . Melanoma Sister 72       several times  . Breast cancer Paternal Grandmother 72  . Ovarian cancer Paternal Aunt 62    Social History   Socioeconomic History  . Marital status: Significant Other    Spouse name: Not on file  . Number of children: Not on file  . Years of education: Not on file  . Highest education level: Not on file  Occupational History  . Not on file  Tobacco Use  . Smoking status: Never  . Smokeless tobacco: Never  Vaping Use  . Vaping status: Never Used  Substance and Sexual Activity  . Alcohol use: Yes  . Drug use: No  . Sexual activity: Yes    Partners: Male    Birth control/protection: I.U.D.  Comment: Mirena  Other Topics Concern  . Not on file  Social History Narrative  . Not on file   Social Drivers of Health   Financial Resource Strain: Not on file  Food Insecurity: Not on file  Transportation Needs: Not on file  Physical Activity: Inactive (01/17/2019)   Exercise Vital Sign   . Days of Exercise per Week: 0 days   . Minutes of Exercise per Session: 0 min  Stress: No Stress Concern Present (01/17/2019)   [employer] of Occupational Health - Occupational Stress Questionnaire   . Feeling of Stress : Not at all  Social Connections: Moderately Isolated (01/17/2019)   Social Connection and Isolation Panel [NHANES]   . Frequency of  Communication with Friends and Family: Once a week   . Frequency of Social Gatherings with Friends and Family: Once a week   . Attends Religious Services: More than 4 times per year   . Active Member of Clubs or Organizations: No   . Attends Banker Meetings: Never   . Marital Status: Divorced  Catering manager Violence: Not At Risk (01/17/2019)   Humiliation, Afraid, Rape, and Kick questionnaire   . Fear of Current or Ex-Partner: No   . Emotionally Abused: No   . Physically Abused: No   . Sexually Abused: No    Current Meds  Medication Sig  . B Complex-C (B-COMPLEX WITH VITAMIN C) tablet Take 1 tablet by mouth daily.  Marland Kitchen Bioflavonoid Products (VITAMIN C) CHEW Chew 1 tablet by mouth daily.  . Cholecalciferol (VITAMIN D3 PO) Take 2 tablets by mouth daily.  Marland Kitchen EPINEPHrine 0.3 mg/0.3 mL IJ SOAJ injection Inject into the muscle.  . levonorgestrel (MIRENA) 20 MCG/24HR IUD 1 each by Intrauterine route once.  . sertraline (ZOLOFT) 100 MG tablet Take 1 tablet (100 mg total) by mouth daily.  . Zinc 50 MG TABS Take 25 mg by mouth daily.     ROS:  Review of Systems  Constitutional:  Negative for fatigue, fever and unexpected weight change.  Respiratory:  Negative for cough, shortness of breath and wheezing.   Cardiovascular:  Negative for chest pain, palpitations and leg swelling.  Gastrointestinal:  Negative for blood in stool, constipation, diarrhea, nausea and vomiting.  Endocrine: Negative for cold intolerance, heat intolerance and polyuria.  Genitourinary:  Negative for dyspareunia, dysuria, flank pain, frequency, genital sores, hematuria, menstrual problem, pelvic pain, urgency, vaginal bleeding, vaginal discharge and vaginal pain.  Musculoskeletal:  Negative for back pain, joint swelling and myalgias.  Skin:  Negative for rash.  Neurological:  Negative for dizziness, syncope, light-headedness, numbness and headaches.  Hematological:  Negative for adenopathy.   Psychiatric/Behavioral:  Positive for dysphoric mood. Negative for agitation, confusion, sleep disturbance and suicidal ideas. The patient is not nervous/anxious.      Objective: BP 126/78   Ht 5' 6.5" (1.689 m)   Wt 176 lb (79.8 kg)   BMI 27.98 kg/m    Physical Exam Constitutional:      Appearance: She is well-developed.  Genitourinary:     Vulva normal.     Genitourinary Comments: IUD STRINGS NOT IN CX OS     Right Labia: No rash, tenderness or lesions.    Left Labia: No tenderness, lesions or rash.    No vaginal discharge, erythema or tenderness.      Right Adnexa: not tender and no mass present.    Left Adnexa: not tender and no mass present.    No cervical friability or polyp.  No IUD strings visualized.     Cervical exam comments: IUD STRINGS NOT IN CX OS.     Uterus is not enlarged or tender.  Breasts:    Right: No mass, nipple discharge, skin change or tenderness.     Left: No mass, nipple discharge, skin change or tenderness.  Neck:     Thyroid: No thyromegaly.  Cardiovascular:     Rate and Rhythm: Normal rate and regular rhythm.     Heart sounds: Normal heart sounds. No murmur heard. Pulmonary:     Effort: Pulmonary effort is normal.     Breath sounds: Normal breath sounds.  Abdominal:     Palpations: Abdomen is soft.     Tenderness: There is no abdominal tenderness. There is no guarding or rebound.  Musculoskeletal:        General: Normal range of motion.     Cervical back: Normal range of motion.  Lymphadenopathy:     Cervical: No cervical adenopathy.  Neurological:     General: No focal deficit present.     Mental Status: She is alert and oriented to person, place, and time.     Cranial Nerves: No cranial nerve deficit.  Skin:    General: Skin is warm and dry.  Psychiatric:        Mood and Affect: Mood normal.        Behavior: Behavior normal.        Thought Content: Thought content normal.        Judgment: Judgment normal.  Vitals reviewed.     Assessment/Plan: Encounter for annual routine gynecological examination  Cervical cancer screening - Plan: Cytology - PAP  Screening for HPV (human papillomavirus) - Plan: Cytology - PAP  Encounter for routine checking of intrauterine contraceptive device (IUD)--has 8 yr indication; IUD strings not in cx os; discussed replacement early for better period control. Pt to consider and f/u with menses for replacement prn.   Encounter for screening mammogram for malignant neoplasm of breast - Plan: MM 3D SCREENING MAMMOGRAM BILATERAL BREAST; pt to schedule mammo  Monoallelic mutation of CDKN2A gene--pt seeing derm; no FH pancreatic cancer. Will cont to follow FH  Mood changes - Plan: sertraline (ZOLOFT) 100 MG tablet; Rx RF. Doing well.    No orders of the defined types were placed in this encounter.            GYN counsel breast self exam, mammography screening, adequate intake of calcium and vitamin D, diet and exercise     F/U  Return in about 1 year (around 02/12/2025)./1 yr annual  [place] B. Aadil Sur, PA-C 02/13/2024 3:26 PM

## 2024-02-13 ENCOUNTER — Encounter: Payer: Self-pay | Admitting: Obstetrics and Gynecology

## 2024-02-13 ENCOUNTER — Ambulatory Visit (INDEPENDENT_AMBULATORY_CARE_PROVIDER_SITE_OTHER): Payer: BC Managed Care – PPO | Admitting: Obstetrics and Gynecology

## 2024-02-13 VITALS — BP 126/78 | Ht 66.5 in | Wt 176.0 lb

## 2024-02-13 DIAGNOSIS — Z01419 Encounter for gynecological examination (general) (routine) without abnormal findings: Secondary | ICD-10-CM | POA: Diagnosis not present

## 2024-02-13 DIAGNOSIS — Z1501 Genetic susceptibility to malignant neoplasm of breast: Secondary | ICD-10-CM

## 2024-02-13 DIAGNOSIS — Z1231 Encounter for screening mammogram for malignant neoplasm of breast: Secondary | ICD-10-CM

## 2024-02-13 DIAGNOSIS — Z30431 Encounter for routine checking of intrauterine contraceptive device: Secondary | ICD-10-CM

## 2024-02-13 DIAGNOSIS — R4586 Emotional lability: Secondary | ICD-10-CM

## 2024-02-13 NOTE — Patient Instructions (Signed)
 I value your feedback and you entrusting [place] with your care. If you get a [place] patient survey, I would appreciate you taking the time to let us know about your experience today. Thank you!  [facility] ([place])--[phone]

## 2024-02-14 MED ORDER — SERTRALINE HCL 100 MG PO TABS: 100.0000 mg | ORAL_TABLET | Freq: Every day | ORAL | 2 refills | Status: DC

## 2024-03-21 ENCOUNTER — Ambulatory Visit
Admission: RE | Admit: 2024-03-21 | Discharge: 2024-03-21 | Disposition: A | Discharge status: Home / Self Care | Source: Ambulatory Visit | Attending: Obstetrics and Gynecology | Admitting: Obstetrics and Gynecology

## 2024-03-21 DIAGNOSIS — Z1231 Encounter for screening mammogram for malignant neoplasm of breast: Secondary | ICD-10-CM | POA: Insufficient documentation

## 2024-03-25 ENCOUNTER — Encounter: Payer: Self-pay | Admitting: Obstetrics and Gynecology

## 2024-06-08 DIAGNOSIS — Z6827 Body mass index (BMI) 27.0-27.9, adult: Secondary | ICD-10-CM | POA: Diagnosis not present

## 2024-06-08 DIAGNOSIS — H66002 Acute suppurative otitis media without spontaneous rupture of ear drum, left ear: Secondary | ICD-10-CM | POA: Diagnosis not present

## 2024-09-12 DIAGNOSIS — D2272 Melanocytic nevi of left lower limb, including hip: Secondary | ICD-10-CM | POA: Diagnosis not present

## 2024-09-12 DIAGNOSIS — D2262 Melanocytic nevi of left upper limb, including shoulder: Secondary | ICD-10-CM | POA: Diagnosis not present

## 2024-09-12 DIAGNOSIS — D2261 Melanocytic nevi of right upper limb, including shoulder: Secondary | ICD-10-CM | POA: Diagnosis not present

## 2024-09-12 DIAGNOSIS — D2271 Melanocytic nevi of right lower limb, including hip: Secondary | ICD-10-CM | POA: Diagnosis not present

## 2024-11-26 ENCOUNTER — Other Ambulatory Visit: Payer: Self-pay

## 2024-11-26 DIAGNOSIS — R4586 Emotional lability: Secondary | ICD-10-CM

## 2024-11-26 MED ORDER — SERTRALINE HCL 100 MG PO TABS: 100.0000 mg | ORAL_TABLET | Freq: Every day | ORAL | 0 refills | Status: AC

## 2024-11-26 NOTE — Telephone Encounter (Signed)
 Patient states she is out of sertraline  (ZOLOFT ) 100 MG tablet . Her annual is not due until 01/2025. Requesting refill. Chart reviewed. 01/2024 annual states:  Mood changes - Plan: sertraline  (ZOLOFT ) 100 MG tablet; Rx RF; usually takes 50 mg, sometimes 100 mg. Has decreased stress with job and is exercising again and feels so much better.  Doing well   Patient states she take 100 mg daily now due to increased stressors. Rx sent 02/13/24: sertraline  (ZOLOFT ) 100 MG tablet 90 tablet 2 02/14/2024 --   Sig - Route: Take 1 tablet (100 mg total) by mouth daily. - Oral   Sent to pharmacy as: sertraline  (ZOLOFT ) 100 MG tablet   E-Prescribing Status: Receipt confirmed by pharmacy (02/14/2024  9:45 AM EDT)    Only 2 refills were sent (not enough for full year). 1 refill sent today to get to next annual.
